# Patient Record
Sex: Female | Born: 1985
Health system: Southern US, Community
[De-identification: ages and names within clinical notes are randomized; demographics above are authoritative.]

## PROBLEM LIST (undated history)

## (undated) ENCOUNTER — Inpatient Hospital Stay (HOSPITAL_COMMUNITY): Payer: 59

## (undated) DIAGNOSIS — J45909 Unspecified asthma, uncomplicated: Secondary | ICD-10-CM

## (undated) HISTORY — DX: Unspecified asthma, uncomplicated: J45.909

---

## 2014-12-16 ENCOUNTER — Ambulatory Visit: Payer: Medicaid Other | Attending: Internal Medicine | Admitting: Internal Medicine

## 2014-12-16 ENCOUNTER — Encounter: Payer: Self-pay | Admitting: Internal Medicine

## 2014-12-16 VITALS — BP 126/80 | HR 98 | Temp 98.8°F | Resp 16 | Ht 66.5 in | Wt 211.0 lb

## 2014-12-16 DIAGNOSIS — O99330 Smoking (tobacco) complicating pregnancy, unspecified trimester: Secondary | ICD-10-CM | POA: Diagnosis not present

## 2014-12-16 DIAGNOSIS — Z8249 Family history of ischemic heart disease and other diseases of the circulatory system: Secondary | ICD-10-CM | POA: Diagnosis not present

## 2014-12-16 DIAGNOSIS — Z331 Pregnant state, incidental: Secondary | ICD-10-CM | POA: Diagnosis not present

## 2014-12-16 DIAGNOSIS — Z3201 Encounter for pregnancy test, result positive: Secondary | ICD-10-CM | POA: Diagnosis present

## 2014-12-16 DIAGNOSIS — Z3A00 Weeks of gestation of pregnancy not specified: Secondary | ICD-10-CM | POA: Insufficient documentation

## 2014-12-16 DIAGNOSIS — Z3490 Encounter for supervision of normal pregnancy, unspecified, unspecified trimester: Secondary | ICD-10-CM

## 2014-12-16 DIAGNOSIS — F17210 Nicotine dependence, cigarettes, uncomplicated: Secondary | ICD-10-CM | POA: Insufficient documentation

## 2014-12-16 DIAGNOSIS — N926 Irregular menstruation, unspecified: Secondary | ICD-10-CM | POA: Diagnosis not present

## 2014-12-16 LAB — POCT URINE PREGNANCY: PREG TEST UR: POSITIVE — AB

## 2014-12-16 NOTE — Progress Notes (Signed)
Positive pregnancy test at home  LMP 11/10/2014

## 2014-12-16 NOTE — Progress Notes (Signed)
Patient ID: Rhonda Mullins Aschenbrenner, female   DOB: 02/25/1985, 29 y.o.   MRN: [id number]  CC: positive home pregnancy test  HPI: Rhonda Mullins Liddy is a 29 y.o. female here to establish care.  Patient has no past medical history. Patient reports that she took a home pregnancy test two weeks ago and had a positive result. She states that in March of this year she had a ectopic pregnancy and is afraid the same thing may happen again. She has begun taking a daily prenatal vitamin. She needs a letter confirming pregnancy and a referral to a OB/GYN. Her LMP was 11/10/14. She has no complaints today.   No Known Allergies History reviewed. No pertinent past medical history. No current outpatient prescriptions on file prior to visit.   No current facility-administered medications on file prior to visit.   Family History  Problem Relation Age of Onset  . Hypertension Mother    Social History   Social History  . Marital Status: Married    Spouse Name: N/A  . Number of Children: N/A  . Years of Education: N/A   Occupational History  . Not on file.   Social History Main Topics  . Smoking status: Current Every Day Smoker -- 0.50 packs/day for 2 years    Types: Cigarettes  . Smokeless tobacco: Not on file  . Alcohol Use: No  . Drug Use: No  . Sexual Activity: Not on file   Other Topics Concern  . Not on file   Social History Narrative  . No narrative on file    Review of Systems: Constitutional: Negative for fever, chills, diaphoresis, activity change, appetite change and fatigue. HENT: Negative for ear pain, nosebleeds, congestion, facial swelling, rhinorrhea, neck pain, neck stiffness and ear discharge.  Eyes: Negative for pain, discharge, redness, itching and visual disturbance. Respiratory: Negative for cough, choking, chest tightness, shortness of breath, wheezing and stridor.  Cardiovascular: Negative for chest pain, palpitations and leg swelling. Gastrointestinal: Negative for abdominal  distention. Genitourinary: Negative for dysuria, urgency, frequency, hematuria, flank pain, decreased urine volume, difficulty urinating and dyspareunia.  Musculoskeletal: Negative for back pain, joint swelling, arthralgias and gait problem. Neurological: Negative for dizziness, tremors, seizures, syncope, facial asymmetry, speech difficulty, weakness, light-headedness, numbness and headaches.  Hematological: Negative for adenopathy. Does not bruise/bleed easily. Psychiatric/Behavioral: Negative for hallucinations, behavioral problems, confusion, dysphoric mood, decreased concentration and agitation.    Objective:   Filed Vitals:   12/16/14 1632  BP: 126/80  Pulse: 98  Temp: 98.8 F (37.1 C)  Resp: 16    Physical Exam  Constitutional: She is oriented to person, place, and time.  Cardiovascular: Normal rate, regular rhythm and normal heart sounds.   Pulmonary/Chest: Effort normal and breath sounds normal.  Neurological: She is alert and oriented to person, place, and time.  Skin: Skin is warm and dry.  Psychiatric: She has a normal mood and affect.    No results found for: WBC, HGB, HCT, MCV, PLT No results found for: CREATININE, BUN, NA, K, CL, CO2  No results found for: HGBA1C Lipid Panel  No results found for: CHOL, TRIG, HDL, CHOLHDL, VLDL, LDLCALC     Assessment and plan:   Athena Masseenarkai was seen today for menstrual problem.  Diagnoses and all orders for this visit:  Missed period -     POCT urine pregnancy  Pregnancy -     Ambulatory referral to Obstetrics / Gynecology Letter given confirming pregnancy. Referral sent  Follow up as needed.  Ambrose Finland, NP-C [facility] [phone] 12/16/2014, 5:17 PM

## 2015-01-12 ENCOUNTER — Encounter: Payer: Medicaid Other | Admitting: Family Medicine

## 2015-02-21 NOTE — L&D Delivery Note (Signed)
Pt complete and at +2 station with uncontrollable urge to push - no pain meds.  Pt pushed 2-3times  to deliver a viable female infant in OP position over intact perineum. Anterior shoulder delivered immediately  and posterior shoulder delivered with next push Body easily followed next. Infant placed on mothers abdomen with luscious spontaneous cry noted. . Cord was then clamped and cut by FOB after . Cord blood obtained. Placenta then delivered about 1-2 mins later intact, 3VC shultz. Fundal massage performed and pitocin per protocol. Fundus firm. Inspection confirmed no lacerations.  Mother and baby stable. Counts correct

## 2015-03-03 DIAGNOSIS — Z36 Encounter for antenatal screening of mother: Secondary | ICD-10-CM | POA: Diagnosis not present

## 2015-03-23 DIAGNOSIS — Z3A19 19 weeks gestation of pregnancy: Secondary | ICD-10-CM | POA: Diagnosis not present

## 2015-03-23 DIAGNOSIS — O0932 Supervision of pregnancy with insufficient antenatal care, second trimester: Secondary | ICD-10-CM | POA: Diagnosis not present

## 2015-03-23 DIAGNOSIS — O99332 Smoking (tobacco) complicating pregnancy, second trimester: Secondary | ICD-10-CM | POA: Diagnosis not present

## 2015-03-23 DIAGNOSIS — Z36 Encounter for antenatal screening of mother: Secondary | ICD-10-CM | POA: Diagnosis not present

## 2015-04-01 DIAGNOSIS — N390 Urinary tract infection, site not specified: Secondary | ICD-10-CM | POA: Diagnosis not present

## 2015-05-03 DIAGNOSIS — O99332 Smoking (tobacco) complicating pregnancy, second trimester: Secondary | ICD-10-CM | POA: Diagnosis not present

## 2015-05-03 DIAGNOSIS — Z3A24 24 weeks gestation of pregnancy: Secondary | ICD-10-CM | POA: Diagnosis not present

## 2015-06-03 DIAGNOSIS — Z3A29 29 weeks gestation of pregnancy: Secondary | ICD-10-CM | POA: Diagnosis not present

## 2015-06-03 DIAGNOSIS — O99333 Smoking (tobacco) complicating pregnancy, third trimester: Secondary | ICD-10-CM | POA: Diagnosis not present

## 2015-06-03 DIAGNOSIS — Z36 Encounter for antenatal screening of mother: Secondary | ICD-10-CM | POA: Diagnosis not present

## 2015-06-03 DIAGNOSIS — Z23 Encounter for immunization: Secondary | ICD-10-CM | POA: Diagnosis not present

## 2015-06-17 DIAGNOSIS — Z3483 Encounter for supervision of other normal pregnancy, third trimester: Secondary | ICD-10-CM | POA: Diagnosis not present

## 2015-06-17 DIAGNOSIS — Z3A31 31 weeks gestation of pregnancy: Secondary | ICD-10-CM | POA: Diagnosis not present

## 2015-06-18 DIAGNOSIS — R7309 Other abnormal glucose: Secondary | ICD-10-CM | POA: Diagnosis not present

## 2015-06-23 ENCOUNTER — Encounter: Payer: Medicaid Other | Attending: Obstetrics and Gynecology

## 2015-06-23 VITALS — Ht 66.0 in | Wt 202.0 lb

## 2015-06-23 DIAGNOSIS — O24419 Gestational diabetes mellitus in pregnancy, unspecified control: Secondary | ICD-10-CM | POA: Diagnosis not present

## 2015-06-23 DIAGNOSIS — O24410 Gestational diabetes mellitus in pregnancy, diet controlled: Secondary | ICD-10-CM

## 2015-07-01 DIAGNOSIS — Z3A33 33 weeks gestation of pregnancy: Secondary | ICD-10-CM | POA: Diagnosis not present

## 2015-07-01 DIAGNOSIS — O24410 Gestational diabetes mellitus in pregnancy, diet controlled: Secondary | ICD-10-CM | POA: Diagnosis not present

## 2015-07-01 DIAGNOSIS — O99333 Smoking (tobacco) complicating pregnancy, third trimester: Secondary | ICD-10-CM | POA: Diagnosis not present

## 2015-07-16 DIAGNOSIS — O24410 Gestational diabetes mellitus in pregnancy, diet controlled: Secondary | ICD-10-CM | POA: Diagnosis not present

## 2015-07-16 DIAGNOSIS — O99333 Smoking (tobacco) complicating pregnancy, third trimester: Secondary | ICD-10-CM | POA: Diagnosis not present

## 2015-07-16 DIAGNOSIS — Z3A35 35 weeks gestation of pregnancy: Secondary | ICD-10-CM | POA: Diagnosis not present

## 2015-07-16 DIAGNOSIS — Z36 Encounter for antenatal screening of mother: Secondary | ICD-10-CM | POA: Diagnosis not present

## 2015-07-16 DIAGNOSIS — N760 Acute vaginitis: Secondary | ICD-10-CM | POA: Diagnosis not present

## 2015-07-20 NOTE — Progress Notes (Signed)
  Patient was seen on 5/30/17for Gestational Diabetes self-management class at the [facility]. The following learning objectives were met by the patient during this course:   States the definition of Gestational Diabetes  States why dietary management is important in controlling blood glucose  Describes the effects each nutrient has on blood glucose levels  Demonstrates ability to create a balanced meal plan  Demonstrates carbohydrate counting   States when to check blood glucose levels  Demonstrates proper blood glucose monitoring techniques  States the effect of stress and exercise on blood glucose levels  States the importance of limiting caffeine and abstaining from alcohol and smoking  Blood glucose monitor given: Verio Lot # [id number] Exp: 09/19/16 Blood glucose reading: 88  Patient instructed to monitor glucose levels: FBS: 60 - <90 1 hour: <140  *Patient received handouts:  Nutrition Diabetes and Pregnancy  Carbohydrate Counting List  Patient will be seen for follow-up as needed.

## 2015-08-12 ENCOUNTER — Encounter: Payer: Self-pay | Admitting: Obstetrics and Gynecology

## 2015-08-13 ENCOUNTER — Encounter: Payer: Self-pay | Admitting: Obstetrics and Gynecology

## 2015-08-13 ENCOUNTER — Telehealth (HOSPITAL_COMMUNITY): Payer: Self-pay | Admitting: General Practice

## 2015-08-13 ENCOUNTER — Encounter (HOSPITAL_COMMUNITY): Payer: Self-pay | Admitting: General Practice

## 2015-08-14 ENCOUNTER — Inpatient Hospital Stay (HOSPITAL_COMMUNITY)
Admission: RE | Admit: 2015-08-14 | Discharge: 2015-08-16 | DRG: 775 | Disposition: A | Discharge status: Home / Self Care | Payer: 59 | Source: Ambulatory Visit | Attending: Obstetrics and Gynecology | Admitting: Obstetrics and Gynecology

## 2015-08-14 ENCOUNTER — Inpatient Hospital Stay (HOSPITAL_COMMUNITY): Payer: 59 | Admitting: Anesthesiology

## 2015-08-14 ENCOUNTER — Encounter (HOSPITAL_COMMUNITY): Payer: Self-pay

## 2015-08-14 DIAGNOSIS — Z3490 Encounter for supervision of normal pregnancy, unspecified, unspecified trimester: Secondary | ICD-10-CM

## 2015-08-14 DIAGNOSIS — O9952 Diseases of the respiratory system complicating childbirth: Secondary | ICD-10-CM | POA: Diagnosis present

## 2015-08-14 DIAGNOSIS — Z8249 Family history of ischemic heart disease and other diseases of the circulatory system: Secondary | ICD-10-CM | POA: Diagnosis not present

## 2015-08-14 DIAGNOSIS — J45909 Unspecified asthma, uncomplicated: Secondary | ICD-10-CM | POA: Diagnosis present

## 2015-08-14 DIAGNOSIS — O99334 Smoking (tobacco) complicating childbirth: Secondary | ICD-10-CM | POA: Diagnosis present

## 2015-08-14 DIAGNOSIS — Z3A39 39 weeks gestation of pregnancy: Secondary | ICD-10-CM | POA: Diagnosis not present

## 2015-08-14 DIAGNOSIS — F17210 Nicotine dependence, cigarettes, uncomplicated: Secondary | ICD-10-CM | POA: Diagnosis present

## 2015-08-14 DIAGNOSIS — O24420 Gestational diabetes mellitus in childbirth, diet controlled: Principal | ICD-10-CM | POA: Diagnosis present

## 2015-08-14 DIAGNOSIS — O99824 Streptococcus B carrier state complicating childbirth: Secondary | ICD-10-CM | POA: Diagnosis present

## 2015-08-14 LAB — CBC
HCT: 34.5 % — ABNORMAL LOW (ref 36.0–46.0)
HEMOGLOBIN: 11.9 g/dL — AB (ref 12.0–15.0)
MCH: 29.1 pg (ref 26.0–34.0)
MCHC: 34.5 g/dL (ref 30.0–36.0)
MCV: 84.4 fL (ref 78.0–100.0)
Platelets: 226 10*3/uL (ref 150–400)
RBC: 4.09 MIL/uL (ref 3.87–5.11)
RDW: 14 % (ref 11.5–15.5)
WBC: 8.1 10*3/uL (ref 4.0–10.5)

## 2015-08-14 LAB — TYPE AND SCREEN
ABO/RH(D): B POS
Antibody Screen: NEGATIVE

## 2015-08-14 LAB — ABO/RH: ABO/RH(D): B POS

## 2015-08-14 MED ORDER — OXYTOCIN 40 UNITS IN LACTATED RINGERS INFUSION - SIMPLE MED: 2.5000 [IU]/h | INTRAVENOUS | Status: DC

## 2015-08-14 MED ORDER — PHENYLEPHRINE 40 MCG/ML (10ML) SYRINGE FOR IV PUSH (FOR BLOOD PRESSURE SUPPORT)
80.0000 ug | PREFILLED_SYRINGE | INTRAVENOUS | Status: DC | PRN
  Filled 2015-08-14: qty 10
  Filled 2015-08-14: qty 5

## 2015-08-14 MED ORDER — DIBUCAINE 1 % RE OINT: 1.0000 "application " | TOPICAL_OINTMENT | RECTAL | Status: DC | PRN

## 2015-08-14 MED ORDER — ACETAMINOPHEN 325 MG PO TABS: 650.0000 mg | ORAL_TABLET | ORAL | Status: DC | PRN

## 2015-08-14 MED ORDER — SENNOSIDES-DOCUSATE SODIUM 8.6-50 MG PO TABS
2.0000 | ORAL_TABLET | ORAL | Status: DC
  Administered 2015-08-14: 2 via ORAL
  Filled 2015-08-14 (×2): qty 2

## 2015-08-14 MED ORDER — EPHEDRINE 5 MG/ML INJ
10.0000 mg | INTRAVENOUS | Status: DC | PRN
  Filled 2015-08-14: qty 2

## 2015-08-14 MED ORDER — SOD CITRATE-CITRIC ACID 500-334 MG/5ML PO SOLN: 30.0000 mL | ORAL | Status: DC | PRN

## 2015-08-14 MED ORDER — ONDANSETRON HCL 4 MG PO TABS: 4.0000 mg | ORAL_TABLET | ORAL | Status: DC | PRN

## 2015-08-14 MED ORDER — LACTATED RINGERS IV SOLN: 500.0000 mL | INTRAVENOUS | Status: DC | PRN

## 2015-08-14 MED ORDER — SIMETHICONE 80 MG PO CHEW: 80.0000 mg | CHEWABLE_TABLET | ORAL | Status: DC | PRN

## 2015-08-14 MED ORDER — COCONUT OIL OIL: 1.0000 "application " | TOPICAL_OIL | Status: DC | PRN

## 2015-08-14 MED ORDER — LIDOCAINE HCL (PF) 1 % IJ SOLN
30.0000 mL | INTRAMUSCULAR | Status: DC | PRN
  Filled 2015-08-14: qty 30

## 2015-08-14 MED ORDER — PENICILLIN G POTASSIUM 5000000 UNITS IJ SOLR
5.0000 10*6.[IU] | Freq: Once | INTRAMUSCULAR | Status: AC
  Administered 2015-08-14: 5 10*6.[IU] via INTRAVENOUS
  Filled 2015-08-14: qty 5

## 2015-08-14 MED ORDER — DEXTROSE 5 % IV SOLN
2.5000 10*6.[IU] | INTRAVENOUS | Status: DC
  Administered 2015-08-14: 2.5 10*6.[IU] via INTRAVENOUS
  Filled 2015-08-14 (×5): qty 2.5

## 2015-08-14 MED ORDER — OXYTOCIN 40 UNITS IN LACTATED RINGERS INFUSION - SIMPLE MED
1.0000 m[IU]/min | INTRAVENOUS | Status: DC
  Administered 2015-08-14: 2 m[IU]/min via INTRAVENOUS
  Filled 2015-08-14: qty 1000

## 2015-08-14 MED ORDER — OXYCODONE-ACETAMINOPHEN 5-325 MG PO TABS: 1.0000 | ORAL_TABLET | ORAL | Status: DC | PRN

## 2015-08-14 MED ORDER — DIPHENHYDRAMINE HCL 50 MG/ML IJ SOLN: 12.5000 mg | INTRAMUSCULAR | Status: DC | PRN

## 2015-08-14 MED ORDER — ONDANSETRON HCL 4 MG/2ML IJ SOLN: 4.0000 mg | Freq: Four times a day (QID) | INTRAMUSCULAR | Status: DC | PRN

## 2015-08-14 MED ORDER — PHENYLEPHRINE 40 MCG/ML (10ML) SYRINGE FOR IV PUSH (FOR BLOOD PRESSURE SUPPORT)
80.0000 ug | PREFILLED_SYRINGE | INTRAVENOUS | Status: DC | PRN
  Filled 2015-08-14: qty 5

## 2015-08-14 MED ORDER — TERBUTALINE SULFATE 1 MG/ML IJ SOLN
0.2500 mg | Freq: Once | INTRAMUSCULAR | Status: DC | PRN
  Filled 2015-08-14: qty 1

## 2015-08-14 MED ORDER — PRENATAL MULTIVITAMIN CH
1.0000 | ORAL_TABLET | Freq: Every day | ORAL | Status: DC
  Administered 2015-08-15 – 2015-08-16 (×2): 1 via ORAL
  Filled 2015-08-14 (×2): qty 1

## 2015-08-14 MED ORDER — OXYTOCIN BOLUS FROM INFUSION: 500.0000 mL | INTRAVENOUS | Status: DC

## 2015-08-14 MED ORDER — ZOLPIDEM TARTRATE 5 MG PO TABS: 5.0000 mg | ORAL_TABLET | Freq: Every evening | ORAL | Status: DC | PRN

## 2015-08-14 MED ORDER — OXYCODONE-ACETAMINOPHEN 5-325 MG PO TABS: 2.0000 | ORAL_TABLET | ORAL | Status: DC | PRN

## 2015-08-14 MED ORDER — LACTATED RINGERS IV SOLN
INTRAVENOUS | Status: DC
  Administered 2015-08-14 (×2): via INTRAVENOUS

## 2015-08-14 MED ORDER — BUTORPHANOL TARTRATE 1 MG/ML IJ SOLN: 1.0000 mg | INTRAMUSCULAR | Status: DC | PRN

## 2015-08-14 MED ORDER — WITCH HAZEL-GLYCERIN EX PADS: 1.0000 "application " | MEDICATED_PAD | CUTANEOUS | Status: DC | PRN

## 2015-08-14 MED ORDER — ONDANSETRON HCL 4 MG/2ML IJ SOLN: 4.0000 mg | INTRAMUSCULAR | Status: DC | PRN

## 2015-08-14 MED ORDER — FENTANYL 2.5 MCG/ML BUPIVACAINE 1/10 % EPIDURAL INFUSION (WH - ANES)
14.0000 mL/h | INTRAMUSCULAR | Status: DC | PRN
Start: 2015-08-14 — End: 2015-08-14
  Administered 2015-08-14: 14 mL/h via EPIDURAL
  Filled 2015-08-14: qty 125

## 2015-08-14 MED ORDER — LACTATED RINGERS IV SOLN
500.0000 mL | Freq: Once | INTRAVENOUS | Status: AC
Start: 2015-08-14 — End: 2015-08-14
  Administered 2015-08-14: 500 mL via INTRAVENOUS

## 2015-08-14 MED ORDER — LIDOCAINE HCL (PF) 1 % IJ SOLN
INTRAMUSCULAR | Status: DC | PRN
  Administered 2015-08-14: 6 mL via EPIDURAL
  Administered 2015-08-14: 4 mL

## 2015-08-14 MED ORDER — TETANUS-DIPHTH-ACELL PERTUSSIS 5-2.5-18.5 LF-MCG/0.5 IM SUSP: 0.5000 mL | Freq: Once | INTRAMUSCULAR | Status: DC

## 2015-08-14 MED ORDER — DIPHENHYDRAMINE HCL 25 MG PO CAPS: 25.0000 mg | ORAL_CAPSULE | Freq: Four times a day (QID) | ORAL | Status: DC | PRN

## 2015-08-14 MED ORDER — BENZOCAINE-MENTHOL 20-0.5 % EX AERO
1.0000 "application " | INHALATION_SPRAY | CUTANEOUS | Status: DC | PRN
  Administered 2015-08-14: 1 via TOPICAL
  Filled 2015-08-14: qty 56

## 2015-08-14 MED ORDER — IBUPROFEN 600 MG PO TABS
600.0000 mg | ORAL_TABLET | Freq: Four times a day (QID) | ORAL | Status: DC
  Administered 2015-08-14 – 2015-08-16 (×8): 600 mg via ORAL
  Filled 2015-08-14 (×8): qty 1

## 2015-08-14 NOTE — Lactation Note (Signed)
This note was copied from a baby's chart. Lactation Consultation Note  Patient Name: Rhonda Mullins ZOXWR'UToday's Date: 08/14/2015 Reason for consult: Initial assessment  Baby 4 hours old. Mom reports that baby latching well. Mom states that she nursed her oldest daughter for 2 years, and her second daughter for just over a year. Mom states that she had a good breast milk supply and no breastfeeding issues. Mom holding the baby STS, so enc mom to continue to offer lots of STS and nurse with cues. Enc mom to ask for assistance with latching as needed. [facility], aware of OP/BFSG and LC phone line assistance after d/c.  Maternal Data Does the patient have breastfeeding experience prior to this delivery?: Yes  Feeding Feeding Type: Breast Fed Length of feed: 10 min  LATCH Score/Interventions Latch: Repeated attempts needed to sustain latch, nipple held in mouth throughout feeding, stimulation needed to elicit sucking reflex. Intervention(s): Adjust position;Breast massage;Breast compression;Assist with latch  Audible Swallowing: A few with stimulation Intervention(s): Skin to skin;Hand expression  Type of Nipple: Everted at rest and after stimulation  Comfort (Breast/Nipple): Soft / non-tender     Hold (Positioning): Assistance needed to correctly position infant at breast and maintain latch. Intervention(s): Breastfeeding basics reviewed;Support Pillows;Position options;Skin to skin  LATCH Score: 7  Lactation Tools Discussed/Used     Consult Status Consult Status: Follow-up Date: 08/15/15 Follow-up type: In-patient    Rhonda Mullins, Jalesia Loudenslager 08/14/2015, 7:47 PM

## 2015-08-14 NOTE — Anesthesia Preprocedure Evaluation (Signed)

## 2015-08-14 NOTE — H&P (Signed)
Rhonda Mullins is a 30 y.o. female presenting for scheduled iol for favorable cervix at term. Maternal Medical History:  Reason for admission: Nausea. Favorable cervix at term  Contractions: Onset was 2 days ago.   Frequency: irregular.   Perceived severity is mild.    Fetal activity: Perceived fetal activity is normal.   Last perceived fetal movement was within the past hour.    Prenatal complications: no prenatal complications Prenatal Complications - Diabetes: gestational. Diabetes is managed by diet.      OB History    Gravida Para Term Preterm AB TAB SAB Ectopic Multiple Living   5 2 2  0 2 1 0 1 0 2     Past Medical History  Diagnosis Date  . Asthma     allergy related   History reviewed. No pertinent past surgical history. Family History: family history includes Hypertension in her mother. Social History:  reports that she has been smoking Cigarettes.  She has a 1 pack-year smoking history. She does not have any smokeless tobacco history on file. She reports that she does not drink alcohol or use illicit drugs.   Prenatal Transfer Tool  Maternal Diabetes: Yes:  Diabetes Type:  Diet controlled Genetic Screening: Normal Maternal Ultrasounds/Referrals: Normal Fetal Ultrasounds or other Referrals:  None Maternal Substance Abuse:  No Significant Maternal Medications:  None Significant Maternal Lab Results:  Lab values include: Group B Strep positive Other Comments:  None  Review of Systems  Constitutional: Positive for malaise/fatigue. Negative for fever, chills and weight loss.  Eyes: Negative for blurred vision.  Respiratory: Negative for shortness of breath.   Cardiovascular: Negative for chest pain.  Gastrointestinal: Negative for heartburn, nausea, vomiting and abdominal pain.  Genitourinary: Negative for dysuria.  Musculoskeletal: Negative for back pain.  Skin: Negative for itching and rash.  Neurological: Negative for dizziness and headaches.   Psychiatric/Behavioral: Negative for depression, suicidal ideas, hallucinations and substance abuse. The patient is not nervous/anxious.     Dilation: 4 Effacement (%): 70, 80 Station: -2 Exam by:: Demetrice Amstutz Blood pressure 130/70, pulse 88, temperature 98.1 F (36.7 C), temperature source Oral, resp. rate 18, height 5\' 6"  (1.676 m), weight 200 lb (90.719 kg), last menstrual period 11/10/2014. Maternal Exam:  Uterine Assessment: Contraction strength is moderate.  Contraction frequency is regular.   Abdomen: Patient reports generalized tenderness.  Estimated fetal weight is AGA.   Fetal presentation: vertex  Introitus: Normal vulva. Normal vagina.  Pelvis: adequate for delivery.   Cervix: Cervix evaluated by digital exam.     Physical Exam  Constitutional: She is oriented to person, place, and time. She appears well-developed.  Neck: Normal range of motion.  Cardiovascular: Normal rate.   Respiratory: Effort normal.  GI: Soft. There is generalized tenderness.  Genitourinary: Vagina normal and uterus normal.  Musculoskeletal: Normal range of motion.  Neurological: She is alert and oriented to person, place, and time.  Skin: Skin is warm.  Psychiatric: She has a normal mood and affect. Her behavior is normal. Judgment and thought content normal.    Prenatal labs: ABO, Rh: --/--/B POS (06/24 [id number]) Antibody: NEG (06/24 0828) Rubella:   RPR:    HBsAg:    HIV:    GBS:     Assessment/Plan: [id number]: G5P2022 at 39 4/[redacted]wks gestation with GDMA1 for iol at term with favorable cervix Pit at 4mus AROM performed with scant return of fluid Pain control prn PCN for GBs tx Anticipate svd    [facility] Polycl[facility] LtdCecilia Worema Ayanna Gheen 08/14/2015, 11:51 AM

## 2015-08-14 NOTE — Progress Notes (Signed)
Patient ID: Rhonda Mullins, female   DOB: 05/28/1985, 30 y.o.   MRN: [id number] Pt c/o painful contractions.  SVE 5/100/-1  Pt opts for nitrous gas to help relieve pain - order placed Continue  Pitocin per protocol - now at 10mus

## 2015-08-14 NOTE — Anesthesia Pain Management Evaluation Note (Signed)
  CRNA Pain Management Visit Note  Patient: Rhonda Mullins, 30 y.o., female  "Hello I am a member of the anesthesia team at [facility]. We have an anesthesia team available at all times to provide care throughout the hospital, including epidural management and anesthesia for C-section. I don't know your plan for the delivery whether it a natural birth, water birth, IV sedation, nitrous supplementation, doula or epidural, but we want to meet your pain goals."   1.Was your pain managed to your expectations on prior hospitalizations?     2.What is your expectation for pain management during this hospitalization?       3.How can we help you reach that goal?   Record the patient's initial score and the patient's pain goal.   Pain: 3  Pain Goal: 10; pt wishes to deliver naturally as previous two pregnancies.  [facility] wants you to be able to say your pain was always managed very well.  Rhonda Mullins,Rhonda Mullins 08/14/2015

## 2015-08-14 NOTE — Anesthesia Procedure Notes (Signed)
Epidural Patient location during procedure: OB  Preanesthetic Checklist Completed: patient identified, site marked, surgical consent, pre-op evaluation, timeout performed, IV checked, risks and benefits discussed and monitors and equipment checked  Epidural Patient position: sitting Prep: site prepped and draped and DuraPrep Patient monitoring: continuous pulse ox and blood pressure Approach: midline Injection technique: LOR air  Needle:  Needle type: Tuohy  Needle gauge: 17 G Needle length: 9 cm and 9 Needle insertion depth: 6 cm Catheter type: closed end flexible Catheter size: 19 Gauge Catheter at skin depth: 12 cm Test dose: negative  Assessment Events: blood not aspirated, injection not painful, no injection resistance, negative IV test and no paresthesia  Additional Notes Dosing of Epidural:  1st dose, through catheter .............................................  Xylocaine 40 mg  2nd dose, through catheter, after waiting 3 minutes.........Xylocaine 60 mg    As each dose occurred, patient was free of IV sx; and patient exhibited no evidence of SA injection.  Patient is more comfortable after epidural dosed. Please see RN's note for documentation of vital signs,and FHR which are stable.  Patient reminded not to try to ambulate with numb legs, and that an RN must be present when she attempts to get up.        

## 2015-08-15 LAB — CBC
HCT: 29.9 % — ABNORMAL LOW (ref 36.0–46.0)
HEMOGLOBIN: 10.3 g/dL — AB (ref 12.0–15.0)
MCH: 28.6 pg (ref 26.0–34.0)
MCHC: 34.4 g/dL (ref 30.0–36.0)
MCV: 83.1 fL (ref 78.0–100.0)
Platelets: 224 10*3/uL (ref 150–400)
RBC: 3.6 MIL/uL — AB (ref 3.87–5.11)
RDW: 13.9 % (ref 11.5–15.5)
WBC: 11.8 10*3/uL — AB (ref 4.0–10.5)

## 2015-08-15 LAB — RPR: RPR: NONREACTIVE

## 2015-08-15 MED ORDER — IBUPROFEN 600 MG PO TABS: 600.0000 mg | ORAL_TABLET | Freq: Four times a day (QID) | ORAL | Status: DC

## 2015-08-15 NOTE — Lactation Note (Signed)
This note was copied from a baby's chart. Lactation Consultation Note; Mom called for assist with latch- wants to make sure she is doing it right. Experienced BF mom but youngest is 29 years old. Baby sleepy. Mom reports baby fed a lot through the night and has been sleepy today. Assisted mom with latch mom reports some pain with latch- untucked bottom lip and mom reports that feels better. Baby off to sleep. Encouragement given. No further questions at present. Reviewed our phone number to call with questions, OP appointments and BFSG. To call prn  Patient Name: Girl Dessa Phienarka Mckoy ZOXWR'UToday's Date: 08/15/2015 Reason for consult: Follow-up assessment   Maternal Data Formula Feeding for Exclusion: No Has patient been taught Hand Expression?: Yes Does the patient have breastfeeding experience prior to this delivery?: Yes  Feeding Feeding Type: Breast Fed Length of feed: 5 min  LATCH Score/Interventions Latch: Too sleepy or reluctant, no latch achieved, no sucking elicited. Intervention(s):  (few sucks) Intervention(s): Adjust position;Assist with latch  Audible Swallowing: None Intervention(s): Skin to skin Intervention(s): Skin to skin  Type of Nipple: Everted at rest and after stimulation  Comfort (Breast/Nipple): Filling, red/small blisters or bruises, mild/mod discomfort  Problem noted: Mild/Moderate discomfort Interventions (Mild/moderate discomfort): Hand expression  Hold (Positioning): Assistance needed to correctly position infant at breast and maintain latch. Intervention(s): Breastfeeding basics reviewed  LATCH Score: 4  Lactation Tools Discussed/Used     Consult Status Consult Status: Follow-up Date: 08/16/15 Follow-up type: In-patient    Pamelia HoitWeeks, Waris Rodger D 08/15/2015, 1:18 PM

## 2015-08-15 NOTE — Discharge Summary (Signed)
OB Discharge Summary     Patient Name: Rhonda Mullins DOB: 08/09/1985 MRN: [id number]  Date of admission: 08/14/2015 Delivering MD: Pryor OchoaBANGA, Emannuel Vise [facility]   Date of discharge: 08/15/2015  Admitting diagnosis: INDUCTION Intrauterine pregnancy: 5165w4d     Secondary diagnosis:  Active Problems:   Pregnancy   SVD (spontaneous vaginal delivery)   Postpartum care following vaginal delivery  Additional problems: none     Discharge diagnosis: Term Pregnancy Delivered and GDM A1                                                                                                Post partum procedures:none  Augmentation: AROM and Pitocin  Complications: None  Hospital course:  Induction of Labor With Vaginal Delivery   30 y.o. yo [id number] at 2265w4d was admitted to the hospital 08/14/2015 for induction of labor.  Indication for induction: A1 DM.  Patient had an uncomplicated labor course as follows: Membrane Rupture Time/Date: 10:20 AM ,08/14/2015   Intrapartum Procedures: Episiotomy: None [1]                                         Lacerations:  None [1]  Patient had delivery of a Viable infant.  Information for the patient's newborn:  Omer JackCooper, Girl Raeshawn [[id number]][[id number]]  Delivery Method: Vaginal, Spontaneous Delivery (Filed from Delivery Summary)   08/14/2015  Details of delivery can be found in separate delivery note.  Patient had a routine postpartum course. Patient is discharged home 08/15/2015.   Physical exam  Filed Vitals:   08/14/15 1831 08/14/15 1930 08/15/15 0000 08/15/15 0500  BP: 133/69 120/63 126/60 125/71  Pulse: 76 78 75 71  Temp: 98.5 F (36.9 C) 98.3 F (36.8 C) 98.4 F (36.9 C) 98.1 F (36.7 C)  TempSrc: Oral Oral Oral Oral  Resp: 20 18 18 18   Height:      Weight:      SpO2: 100%      General: alert, cooperative and no distress Lochia: appropriate Uterine Fundus: firm Incision: N/A DVT Evaluation: No evidence of DVT seen on physical exam. No significant  calf/ankle edema. Labs: Lab Results  Component Value Date   WBC 11.8* 08/15/2015   HGB 10.3* 08/15/2015   HCT 29.9* 08/15/2015   MCV 83.1 08/15/2015   PLT 224 08/15/2015   No flowsheet data found.  Discharge instruction: per After Visit Summary and "Baby and Me Booklet".  After visit meds:    Medication List    STOP taking these medications        diphenhydrAMINE 25 MG tablet  Commonly known as:  BENADRYL      TAKE these medications        ibuprofen 600 MG tablet  Commonly known as:  ADVIL,MOTRIN  Take 1 tablet (600 mg total) by mouth every 6 (six) hours.     prenatal multivitamin Tabs tablet  Take 1 tablet by mouth daily at 12 noon.        Diet: routine diet  Activity:  Advance as tolerated. Pelvic rest for 6 weeks.   Outpatient follow up:6 weeks Follow up Appt:No future appointments. Follow up Visit:No Follow-up on file.  Postpartum contraception: Natural Family Planning  Newborn Data: Live born female  Birth Weight: 6 lb 2.8 oz (2801 g) APGAR: 8, 9  Baby Feeding: Breast Disposition:home with mother   08/15/2015 Sharol Givenecilia Worema [place] ColonyBanga, DO

## 2015-08-15 NOTE — Lactation Note (Deleted)
This note was copied from a baby's chart. Lactation Consultation Note; Mom attempting to latch baby as I went into room. Using #20 NS. Baby on and off the breast then going to sleep. Mom pumped for 15 min, obtained 12 ml transitional milk. Baby waking, bottle fed EBM to baby, still showing feeding cues bottle fed 10 ml of Alimentum and baby off to sleep. Parents needed assist with diaper change and burping baby. Parents pleased mom is making more milk. Encouraged to always breast feed first then give bottle of EBM or formula. Encouraged to pump after every feeding to promote milk supply.No questions at present. To call prn Patient Name: Rhonda Dessa Phienarka Thompson RUEAV'WToday's Date: 08/15/2015 Reason for consult: Follow-up assessment   Maternal Data Formula Feeding for Exclusion: No Has patient been taught Hand Expression?: Yes  Feeding Feeding Type: Bottle Fed - Breast Milk Length of feed: 5 min  LATCH Score/Interventions Latch: Repeated attempts needed to sustain latch, nipple held in mouth throughout feeding, stimulation needed to elicit sucking reflex.  Audible Swallowing: None  Type of Nipple: Everted at rest and after stimulation  Comfort (Breast/Nipple): Soft / non-tender     Hold (Positioning): Assistance needed to correctly position infant at breast and maintain latch. Intervention(s): Breastfeeding basics reviewed  LATCH Score: 6  Lactation Tools Discussed/Used Tools: Nipple Shields Nipple shield size: 20 WIC Program: No   Consult Status Consult Status: Follow-up Date: 08/16/15 Follow-up type: In-patient    Rhonda Mullins, Rhonda Mullins 08/15/2015, 11:58 AM

## 2015-08-15 NOTE — Anesthesia Postprocedure Evaluation (Signed)
Anesthesia Post Note  Patient: Rhonda Mullins  Procedure(s) Performed: * No procedures listed *  Patient location during evaluation: Mother Baby Anesthesia Type: Epidural Level of consciousness: oriented and awake and alert Pain management: pain level controlled Vital Signs Assessment: post-procedure vital signs reviewed and stable Respiratory status: spontaneous breathing and nonlabored ventilation Cardiovascular status: stable Postop Assessment: epidural receding, patient able to bend at knees, no signs of nausea or vomiting and adequate PO intake Anesthetic complications: no     Last Vitals:  Filed Vitals:   08/15/15 0000 08/15/15 0500  BP: 126/60 125/71  Pulse: 75 71  Temp: 36.9 C 36.7 C  Resp: 18 18    Last Pain:  Filed Vitals:   08/15/15 0616  PainSc: 4    Pain Goal: Patients Stated Pain Goal: 10 (08/14/15 0824)               Laban EmperorMalinova,Zaiyden Strozier Hristova

## 2015-08-15 NOTE — Discharge Instructions (Signed)
Nothing in vagina for 6 weeks.  No sex, tampons, and douching.  Other instructions as in [facility] Discharge Booklet. °

## 2015-08-15 NOTE — Progress Notes (Signed)
Post Partum Day 1 Subjective: no complaints, up ad lib, voiding, tolerating PO, + flatus and bonding well with baby. Breastfeeding.Requests dicharge to home today  Objective: Blood pressure 125/71, pulse 71, temperature 98.1 F (36.7 C), temperature source Oral, resp. rate 18, height 5\' 6"  (1.676 m), weight 200 lb (90.719 kg), last menstrual period 11/10/2014, SpO2 100 %, unknown if currently breastfeeding.  Physical Exam:  General: alert, cooperative and no distress Lochia: appropriate Uterine Fundus: firm Incision: n/a DVT Evaluation: No evidence of DVT seen on physical exam. No significant calf/ankle edema.   Recent Labs  08/14/15 0828 08/15/15 0525  HGB 11.9* 10.3*  HCT 34.5* 29.9*    Assessment/Plan: Discharge home, Breastfeeding and Contraception - prefers not to use any  Discharge instructions reviewed   LOS: 1 day   Edwinna AreolaCecilia Worema Banga 08/15/2015, 10:37 AM

## 2015-08-16 MED ORDER — PNEUMOCOCCAL VAC POLYVALENT 25 MCG/0.5ML IJ INJ: 0.5000 mL | INJECTION | INTRAMUSCULAR | Status: DC

## 2015-08-16 MED ORDER — PNEUMOCOCCAL VAC POLYVALENT 25 MCG/0.5ML IJ INJ
0.5000 mL | INJECTION | Freq: Once | INTRAMUSCULAR | Status: AC
  Administered 2015-08-16: 0.5 mL via INTRAMUSCULAR
  Filled 2015-08-16: qty 0.5

## 2015-08-16 MED FILL — IBUPROFEN 600 MG TABLET: 600 | 8 days supply | Qty: 30 | Fill #0

## 2015-08-16 NOTE — Lactation Note (Signed)
This note was copied from a baby's chart. Lactation Consultation Note  Patient Name: Girl Dessa Phienarka Vavra NWGNF'AToday's Date: 08/16/2015   Visited with Mom and FOB on day of discharge, baby 5542 hrs old.  Mom states baby is latching and feeding well.  Bilirubin level down this morning, output good.  Breasts are filling today.  Mom to pick up a DEBP in office as she has [employer] insurance.  Talked about engorgement prevention and treatment.  Encouraged skin to skin, and cue based feedings.  Encouraged to call us prn.  Reminded Mom of OP Lactation services available to her.     Judee ClaraSmith, Senan Urey E 08/16/2015, 10:17 AM

## 2015-08-16 NOTE — Progress Notes (Signed)
UR chart review completed.  

## 2015-08-16 NOTE — Progress Notes (Signed)
Patient ID: Rhonda Mullins, female   DOB: 09/23/1985, 30 y.o.   MRN: [id number]  Pt's d/c held 6/25 due to baby's bilirubin.  D/c 08/16/15.

## 2015-08-16 NOTE — Progress Notes (Signed)
Post Partum Day 2 Subjective: no complaints, up ad lib, voiding, tolerating PO and nl lochia, pain controlled  Objective: Blood pressure 120/63, pulse 65, temperature 97.6 F (36.4 C), temperature source Oral, resp. rate 16, height 5\' 6"  (1.676 m), weight 90.719 kg (200 lb), last menstrual period 11/10/2014, SpO2 100 %, unknown if currently breastfeeding.  Physical Exam:  General: alert and no distress Lochia: appropriate Uterine Fundus: firm    Recent Labs  08/14/15 0828 08/15/15 0525  HGB 11.9* 10.3*  HCT 34.5* 29.9*    Assessment/Plan: Discharge home.  D/C canceled yesterday due to baby's bilirubin.  D/C with routine d/c instructions, Motrin and PNV.  F/u 6 weeks   LOS: 2 days   Bovard-Stuckert, Codi Folkerts 08/16/2015, 7:10 AM

## 2015-08-21 DIAGNOSIS — R079 Chest pain, unspecified: Secondary | ICD-10-CM | POA: Diagnosis not present

## 2015-08-21 DIAGNOSIS — R52 Pain, unspecified: Secondary | ICD-10-CM | POA: Diagnosis not present

## 2015-08-21 DIAGNOSIS — F17210 Nicotine dependence, cigarettes, uncomplicated: Secondary | ICD-10-CM | POA: Diagnosis not present

## 2015-08-21 DIAGNOSIS — O909 Complication of the puerperium, unspecified: Secondary | ICD-10-CM | POA: Diagnosis not present

## 2015-08-21 DIAGNOSIS — R0602 Shortness of breath: Secondary | ICD-10-CM | POA: Diagnosis not present

## 2015-08-30 DIAGNOSIS — Z0120 Encounter for dental examination and cleaning without abnormal findings: Secondary | ICD-10-CM | POA: Diagnosis not present

## 2015-09-17 DIAGNOSIS — B351 Tinea unguium: Secondary | ICD-10-CM | POA: Diagnosis not present

## 2015-09-17 DIAGNOSIS — Z3009 Encounter for other general counseling and advice on contraception: Secondary | ICD-10-CM | POA: Diagnosis not present

## 2015-09-17 DIAGNOSIS — Z1389 Encounter for screening for other disorder: Secondary | ICD-10-CM | POA: Diagnosis not present

## 2015-11-19 DIAGNOSIS — N61 Mastitis without abscess: Secondary | ICD-10-CM | POA: Diagnosis not present

## 2016-02-21 NOTE — L&D Delivery Note (Signed)
Delivery Note Pt labored for about an hour and at 8:43 PM a viable female was delivered via Vaginal, Spontaneous Delivery (Presentation: ROA;  ).  APGAR: 9,9 , ; weight pending .   Placenta status: 3vc, shultz, delivered intact, .; no comps Anesthesia: nitrous gas Episiotomy: none  Lacerations: Periurethral Suture Repair: none Est. Blood Loss (mL): 250  Mom to postpartum.  Baby to Couplet care / Skin to Skin.  Cathrine Muster 11/23/2016, 8:55 PM

## 2016-03-07 DIAGNOSIS — N764 Abscess of vulva: Secondary | ICD-10-CM | POA: Diagnosis not present

## 2016-03-07 DIAGNOSIS — J45901 Unspecified asthma with (acute) exacerbation: Secondary | ICD-10-CM | POA: Diagnosis not present

## 2016-04-21 DIAGNOSIS — Z3201 Encounter for pregnancy test, result positive: Secondary | ICD-10-CM | POA: Diagnosis not present

## 2016-04-28 DIAGNOSIS — N912 Amenorrhea, unspecified: Secondary | ICD-10-CM | POA: Diagnosis not present

## 2016-04-28 DIAGNOSIS — O26849 Uterine size-date discrepancy, unspecified trimester: Secondary | ICD-10-CM | POA: Diagnosis not present

## 2016-04-28 DIAGNOSIS — O3680X1 Pregnancy with inconclusive fetal viability, fetus 1: Secondary | ICD-10-CM | POA: Diagnosis not present

## 2016-04-28 DIAGNOSIS — Z3A10 10 weeks gestation of pregnancy: Secondary | ICD-10-CM | POA: Diagnosis not present

## 2016-04-28 DIAGNOSIS — Z368A Encounter for antenatal screening for other genetic defects: Secondary | ICD-10-CM | POA: Diagnosis not present

## 2016-05-11 DIAGNOSIS — F17210 Nicotine dependence, cigarettes, uncomplicated: Secondary | ICD-10-CM | POA: Diagnosis not present

## 2016-05-11 DIAGNOSIS — Z3A12 12 weeks gestation of pregnancy: Secondary | ICD-10-CM | POA: Diagnosis not present

## 2016-05-11 DIAGNOSIS — Z3689 Encounter for other specified antenatal screening: Secondary | ICD-10-CM | POA: Diagnosis not present

## 2016-05-11 DIAGNOSIS — Z3682 Encounter for antenatal screening for nuchal translucency: Secondary | ICD-10-CM | POA: Diagnosis not present

## 2016-05-11 DIAGNOSIS — Z113 Encounter for screening for infections with a predominantly sexual mode of transmission: Secondary | ICD-10-CM | POA: Diagnosis not present

## 2016-05-11 DIAGNOSIS — O99331 Smoking (tobacco) complicating pregnancy, first trimester: Secondary | ICD-10-CM | POA: Diagnosis not present

## 2016-06-26 DIAGNOSIS — O99332 Smoking (tobacco) complicating pregnancy, second trimester: Secondary | ICD-10-CM | POA: Diagnosis not present

## 2016-06-26 DIAGNOSIS — Z3A18 18 weeks gestation of pregnancy: Secondary | ICD-10-CM | POA: Diagnosis not present

## 2016-06-26 DIAGNOSIS — Z363 Encounter for antenatal screening for malformations: Secondary | ICD-10-CM | POA: Diagnosis not present

## 2016-07-27 DIAGNOSIS — N390 Urinary tract infection, site not specified: Secondary | ICD-10-CM | POA: Diagnosis not present

## 2016-08-24 DIAGNOSIS — Z3689 Encounter for other specified antenatal screening: Secondary | ICD-10-CM | POA: Diagnosis not present

## 2016-08-24 DIAGNOSIS — Z3A27 27 weeks gestation of pregnancy: Secondary | ICD-10-CM | POA: Diagnosis not present

## 2016-08-24 DIAGNOSIS — Z8632 Personal history of gestational diabetes: Secondary | ICD-10-CM | POA: Diagnosis not present

## 2016-08-24 DIAGNOSIS — Z23 Encounter for immunization: Secondary | ICD-10-CM | POA: Diagnosis not present

## 2016-08-24 DIAGNOSIS — O24410 Gestational diabetes mellitus in pregnancy, diet controlled: Secondary | ICD-10-CM | POA: Diagnosis not present

## 2016-08-24 DIAGNOSIS — O2302 Infections of kidney in pregnancy, second trimester: Secondary | ICD-10-CM | POA: Diagnosis not present

## 2016-08-24 DIAGNOSIS — Z148 Genetic carrier of other disease: Secondary | ICD-10-CM | POA: Diagnosis not present

## 2016-09-05 DIAGNOSIS — R7309 Other abnormal glucose: Secondary | ICD-10-CM | POA: Diagnosis not present

## 2016-10-04 DIAGNOSIS — O3660X0 Maternal care for excessive fetal growth, unspecified trimester, not applicable or unspecified: Secondary | ICD-10-CM | POA: Diagnosis not present

## 2016-10-04 DIAGNOSIS — O409XX0 Polyhydramnios, unspecified trimester, not applicable or unspecified: Secondary | ICD-10-CM | POA: Diagnosis not present

## 2016-10-04 DIAGNOSIS — Z3A33 33 weeks gestation of pregnancy: Secondary | ICD-10-CM | POA: Diagnosis not present

## 2016-10-09 DIAGNOSIS — O403XX0 Polyhydramnios, third trimester, not applicable or unspecified: Secondary | ICD-10-CM | POA: Diagnosis not present

## 2016-10-09 DIAGNOSIS — Z3A33 33 weeks gestation of pregnancy: Secondary | ICD-10-CM | POA: Diagnosis not present

## 2016-10-12 DIAGNOSIS — Z3A34 34 weeks gestation of pregnancy: Secondary | ICD-10-CM | POA: Diagnosis not present

## 2016-10-12 DIAGNOSIS — O403XX0 Polyhydramnios, third trimester, not applicable or unspecified: Secondary | ICD-10-CM | POA: Diagnosis not present

## 2016-10-16 DIAGNOSIS — Z3A34 34 weeks gestation of pregnancy: Secondary | ICD-10-CM | POA: Diagnosis not present

## 2016-10-16 DIAGNOSIS — O403XX0 Polyhydramnios, third trimester, not applicable or unspecified: Secondary | ICD-10-CM | POA: Diagnosis not present

## 2016-10-19 DIAGNOSIS — O403XX0 Polyhydramnios, third trimester, not applicable or unspecified: Secondary | ICD-10-CM | POA: Diagnosis not present

## 2016-10-19 DIAGNOSIS — Z3A35 35 weeks gestation of pregnancy: Secondary | ICD-10-CM | POA: Diagnosis not present

## 2016-10-20 DIAGNOSIS — Z3685 Encounter for antenatal screening for Streptococcus B: Secondary | ICD-10-CM | POA: Diagnosis not present

## 2016-10-20 DIAGNOSIS — N898 Other specified noninflammatory disorders of vagina: Secondary | ICD-10-CM | POA: Diagnosis not present

## 2016-10-20 DIAGNOSIS — Z3A35 35 weeks gestation of pregnancy: Secondary | ICD-10-CM | POA: Diagnosis not present

## 2016-10-20 DIAGNOSIS — O99333 Smoking (tobacco) complicating pregnancy, third trimester: Secondary | ICD-10-CM | POA: Diagnosis not present

## 2016-10-24 DIAGNOSIS — Z3A35 35 weeks gestation of pregnancy: Secondary | ICD-10-CM | POA: Diagnosis not present

## 2016-10-24 DIAGNOSIS — O403XX0 Polyhydramnios, third trimester, not applicable or unspecified: Secondary | ICD-10-CM | POA: Diagnosis not present

## 2016-10-25 DIAGNOSIS — O99330 Smoking (tobacco) complicating pregnancy, unspecified trimester: Secondary | ICD-10-CM | POA: Diagnosis not present

## 2016-10-25 DIAGNOSIS — O409XX0 Polyhydramnios, unspecified trimester, not applicable or unspecified: Secondary | ICD-10-CM | POA: Diagnosis not present

## 2016-10-25 DIAGNOSIS — Z3A36 36 weeks gestation of pregnancy: Secondary | ICD-10-CM | POA: Diagnosis not present

## 2016-10-25 DIAGNOSIS — O09299 Supervision of pregnancy with other poor reproductive or obstetric history, unspecified trimester: Secondary | ICD-10-CM | POA: Diagnosis not present

## 2016-11-22 DIAGNOSIS — O09293 Supervision of pregnancy with other poor reproductive or obstetric history, third trimester: Secondary | ICD-10-CM | POA: Diagnosis not present

## 2016-11-22 DIAGNOSIS — Z23 Encounter for immunization: Secondary | ICD-10-CM | POA: Diagnosis not present

## 2016-11-22 DIAGNOSIS — Z3A40 40 weeks gestation of pregnancy: Secondary | ICD-10-CM | POA: Diagnosis not present

## 2016-11-22 DIAGNOSIS — O99333 Smoking (tobacco) complicating pregnancy, third trimester: Secondary | ICD-10-CM | POA: Diagnosis not present

## 2016-11-23 ENCOUNTER — Encounter (HOSPITAL_COMMUNITY): Payer: Self-pay | Admitting: Anesthesiology

## 2016-11-23 ENCOUNTER — Inpatient Hospital Stay (HOSPITAL_COMMUNITY)
Admission: AD | Admit: 2016-11-23 | Discharge: 2016-11-25 | DRG: 807 | Disposition: A | Discharge status: Home / Self Care | Payer: 59 | Source: Ambulatory Visit | Attending: Obstetrics and Gynecology | Admitting: Obstetrics and Gynecology

## 2016-11-23 ENCOUNTER — Encounter (HOSPITAL_COMMUNITY): Payer: Self-pay

## 2016-11-23 DIAGNOSIS — F17210 Nicotine dependence, cigarettes, uncomplicated: Secondary | ICD-10-CM | POA: Diagnosis present

## 2016-11-23 DIAGNOSIS — F329 Major depressive disorder, single episode, unspecified: Secondary | ICD-10-CM | POA: Diagnosis present

## 2016-11-23 DIAGNOSIS — O99334 Smoking (tobacco) complicating childbirth: Secondary | ICD-10-CM | POA: Diagnosis present

## 2016-11-23 DIAGNOSIS — O99344 Other mental disorders complicating childbirth: Secondary | ICD-10-CM | POA: Diagnosis present

## 2016-11-23 DIAGNOSIS — Z3490 Encounter for supervision of normal pregnancy, unspecified, unspecified trimester: Secondary | ICD-10-CM

## 2016-11-23 DIAGNOSIS — O26893 Other specified pregnancy related conditions, third trimester: Secondary | ICD-10-CM | POA: Diagnosis present

## 2016-11-23 DIAGNOSIS — Z3A40 40 weeks gestation of pregnancy: Secondary | ICD-10-CM | POA: Diagnosis not present

## 2016-11-23 LAB — CBC
HCT: 34.3 % — ABNORMAL LOW (ref 36.0–46.0)
HEMOGLOBIN: 12.2 g/dL (ref 12.0–15.0)
MCH: 29.6 pg (ref 26.0–34.0)
MCHC: 35.6 g/dL (ref 30.0–36.0)
MCV: 83.3 fL (ref 78.0–100.0)
Platelets: 205 10*3/uL (ref 150–400)
RBC: 4.12 MIL/uL (ref 3.87–5.11)
RDW: 13.3 % (ref 11.5–15.5)
WBC: 8.4 10*3/uL (ref 4.0–10.5)

## 2016-11-23 MED ORDER — LIDOCAINE HCL (PF) 1 % IJ SOLN
30.0000 mL | INTRAMUSCULAR | Status: DC | PRN
  Filled 2016-11-23: qty 30

## 2016-11-23 MED ORDER — DOCUSATE SODIUM 100 MG PO CAPS
100.0000 mg | ORAL_CAPSULE | Freq: Two times a day (BID) | ORAL | Status: DC
  Administered 2016-11-24: 100 mg via ORAL
  Filled 2016-11-23: qty 1

## 2016-11-23 MED ORDER — FENTANYL 2.5 MCG/ML BUPIVACAINE 1/10 % EPIDURAL INFUSION (WH - ANES): 14.0000 mL/h | INTRAMUSCULAR | Status: DC | PRN

## 2016-11-23 MED ORDER — COCONUT OIL OIL: 1.0000 "application " | TOPICAL_OIL | Status: DC | PRN

## 2016-11-23 MED ORDER — FENTANYL 2.5 MCG/ML BUPIVACAINE 1/10 % EPIDURAL INFUSION (WH - ANES)
INTRAMUSCULAR | Status: AC
  Filled 2016-11-23: qty 100

## 2016-11-23 MED ORDER — ZOLPIDEM TARTRATE 5 MG PO TABS: 5.0000 mg | ORAL_TABLET | Freq: Every evening | ORAL | Status: DC | PRN

## 2016-11-23 MED ORDER — ONDANSETRON HCL 4 MG PO TABS: 4.0000 mg | ORAL_TABLET | ORAL | Status: DC | PRN

## 2016-11-23 MED ORDER — TERBUTALINE SULFATE 1 MG/ML IJ SOLN
0.2500 mg | Freq: Once | INTRAMUSCULAR | Status: DC | PRN
  Filled 2016-11-23: qty 1

## 2016-11-23 MED ORDER — PHENYLEPHRINE 40 MCG/ML (10ML) SYRINGE FOR IV PUSH (FOR BLOOD PRESSURE SUPPORT): 80.0000 ug | PREFILLED_SYRINGE | INTRAVENOUS | Status: DC | PRN

## 2016-11-23 MED ORDER — EPHEDRINE 5 MG/ML INJ: 10.0000 mg | INTRAVENOUS | Status: DC | PRN

## 2016-11-23 MED ORDER — OXYCODONE-ACETAMINOPHEN 5-325 MG PO TABS: 1.0000 | ORAL_TABLET | ORAL | Status: DC | PRN

## 2016-11-23 MED ORDER — PHENYLEPHRINE 40 MCG/ML (10ML) SYRINGE FOR IV PUSH (FOR BLOOD PRESSURE SUPPORT)
80.0000 ug | PREFILLED_SYRINGE | INTRAVENOUS | Status: DC | PRN
  Filled 2016-11-23: qty 5

## 2016-11-23 MED ORDER — SOD CITRATE-CITRIC ACID 500-334 MG/5ML PO SOLN: 30.0000 mL | ORAL | Status: DC | PRN

## 2016-11-23 MED ORDER — OXYTOCIN 10 UNIT/ML IJ SOLN
INTRAMUSCULAR | Status: AC
  Filled 2016-11-23: qty 1

## 2016-11-23 MED ORDER — LACTATED RINGERS IV SOLN
INTRAVENOUS | Status: DC
  Administered 2016-11-23: 125 mL via INTRAVENOUS

## 2016-11-23 MED ORDER — PRENATAL MULTIVITAMIN CH
1.0000 | ORAL_TABLET | Freq: Every day | ORAL | Status: DC
  Administered 2016-11-24 – 2016-11-25 (×2): 1 via ORAL
  Filled 2016-11-23 (×2): qty 1

## 2016-11-23 MED ORDER — OXYTOCIN BOLUS FROM INFUSION: 500.0000 mL | Freq: Once | INTRAVENOUS | Status: DC

## 2016-11-23 MED ORDER — PHENYLEPHRINE 40 MCG/ML (10ML) SYRINGE FOR IV PUSH (FOR BLOOD PRESSURE SUPPORT)
PREFILLED_SYRINGE | INTRAVENOUS | Status: AC
  Filled 2016-11-23: qty 10

## 2016-11-23 MED ORDER — DIPHENHYDRAMINE HCL 50 MG/ML IJ SOLN: 12.5000 mg | INTRAMUSCULAR | Status: DC | PRN

## 2016-11-23 MED ORDER — EPHEDRINE 5 MG/ML INJ
10.0000 mg | INTRAVENOUS | Status: DC | PRN
  Filled 2016-11-23: qty 2

## 2016-11-23 MED ORDER — ACETAMINOPHEN 325 MG PO TABS
650.0000 mg | ORAL_TABLET | ORAL | Status: DC | PRN
  Administered 2016-11-23 – 2016-11-24 (×2): 650 mg via ORAL
  Filled 2016-11-23 (×3): qty 2

## 2016-11-23 MED ORDER — ACETAMINOPHEN 325 MG PO TABS: 650.0000 mg | ORAL_TABLET | ORAL | Status: DC | PRN

## 2016-11-23 MED ORDER — TETANUS-DIPHTH-ACELL PERTUSSIS 5-2.5-18.5 LF-MCG/0.5 IM SUSP: 0.5000 mL | Freq: Once | INTRAMUSCULAR | Status: DC

## 2016-11-23 MED ORDER — LACTATED RINGERS IV SOLN: 500.0000 mL | Freq: Once | INTRAVENOUS | Status: DC

## 2016-11-23 MED ORDER — SENNOSIDES-DOCUSATE SODIUM 8.6-50 MG PO TABS
2.0000 | ORAL_TABLET | ORAL | Status: DC
  Administered 2016-11-23: 2 via ORAL
  Filled 2016-11-23 (×2): qty 2

## 2016-11-23 MED ORDER — DIBUCAINE 1 % RE OINT: 1.0000 "application " | TOPICAL_OINTMENT | RECTAL | Status: DC | PRN

## 2016-11-23 MED ORDER — LACTATED RINGERS IV SOLN: 500.0000 mL | INTRAVENOUS | Status: DC | PRN

## 2016-11-23 MED ORDER — OXYTOCIN 10 UNIT/ML IJ SOLN
10.0000 [IU] | Freq: Once | INTRAMUSCULAR | Status: AC
  Administered 2016-11-23: 10 [IU] via INTRAMUSCULAR

## 2016-11-23 MED ORDER — IBUPROFEN 600 MG PO TABS
600.0000 mg | ORAL_TABLET | Freq: Four times a day (QID) | ORAL | Status: DC
  Administered 2016-11-23 – 2016-11-25 (×7): 600 mg via ORAL
  Filled 2016-11-23 (×7): qty 1

## 2016-11-23 MED ORDER — SIMETHICONE 80 MG PO CHEW: 80.0000 mg | CHEWABLE_TABLET | ORAL | Status: DC | PRN

## 2016-11-23 MED ORDER — ONDANSETRON HCL 4 MG/2ML IJ SOLN: 4.0000 mg | INTRAMUSCULAR | Status: DC | PRN

## 2016-11-23 MED ORDER — ONDANSETRON HCL 4 MG/2ML IJ SOLN: 4.0000 mg | Freq: Four times a day (QID) | INTRAMUSCULAR | Status: DC | PRN

## 2016-11-23 MED ORDER — DIPHENHYDRAMINE HCL 25 MG PO CAPS: 25.0000 mg | ORAL_CAPSULE | Freq: Four times a day (QID) | ORAL | Status: DC | PRN

## 2016-11-23 MED ORDER — OXYTOCIN 40 UNITS IN LACTATED RINGERS INFUSION - SIMPLE MED
2.5000 [IU]/h | INTRAVENOUS | Status: DC
  Filled 2016-11-23: qty 1000

## 2016-11-23 MED ORDER — BENZOCAINE-MENTHOL 20-0.5 % EX AERO: 1.0000 "application " | INHALATION_SPRAY | CUTANEOUS | Status: DC | PRN

## 2016-11-23 MED ORDER — OXYCODONE-ACETAMINOPHEN 5-325 MG PO TABS: 2.0000 | ORAL_TABLET | ORAL | Status: DC | PRN

## 2016-11-23 MED ORDER — WITCH HAZEL-GLYCERIN EX PADS: 1.0000 "application " | MEDICATED_PAD | CUTANEOUS | Status: DC | PRN

## 2016-11-23 NOTE — H&P (Signed)
Rhonda Mullins is a 31 y.o. female presenting for painful contractions/active labor. Pt was 8cm on arrival at [facility]. Pt was dated by a 10week [place]; she had a short interval from last full term delivery till this pregnancy.  Hx GDM and gHTN  but none with this pregnancy. She was monitored for polyhydramnios which resolved. Depression controlled on zoloft. First trimester screen neg. 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 GBS neg OB History    Gravida Para Term Preterm AB Living   0 2 1   SAB TAB Ectopic Multiple Live Births   0 1 1 0 1     Past Medical History:  Diagnosis Date  . Asthma    allergy related   No past surgical history on file. Family History: family history includes Hypertension in her mother. Social History:  reports that she has been smoking Cigarettes.  She has a 1.00 pack-year smoking history. She does not have any smokeless tobacco history on file. She reports that she does not drink alcohol or use drugs.     Maternal Diabetes: No Genetic Screening: Abnormal:  Results: Other:+ sma carrier but FOB neg Maternal Ultrasounds/Referrals: Normal- polyhydramnious resolved Fetal Ultrasounds or other Referrals:  None Maternal Substance Abuse:  Yes:  Type: Smoker Significant Maternal Medications:  Meds include: Zoloft Significant Maternal Lab Results:  Lab values include: Group B Strep negative Other Comments:  None  Review of Systems  Constitutional: Positive for malaise/fatigue. Negative for chills, fever and weight loss.  Eyes: Negative for blurred vision.  Respiratory: Positive for shortness of breath.   Cardiovascular: Negative for chest pain.  Gastrointestinal: Positive for abdominal pain. Negative for heartburn, nausea and vomiting.  Genitourinary: Negative for dysuria.  Musculoskeletal: Positive for back pain and myalgias.  Skin: Negative for itching and rash.  Neurological: Negative for dizziness, tingling and headaches.  Endo/Heme/Allergies: Does not bruise/bleed  easily.  Psychiatric/Behavioral: Negative for depression, hallucinations, substance abuse and suicidal ideas. The patient is nervous/anxious.    Maternal Medical History:  Reason for admission: Contractions.  Nausea.  Contractions: Onset was 3-5 hours ago.   Frequency: regular.   Perceived severity is strong.    Fetal activity: Perceived fetal activity is normal.   Last perceived fetal movement was within the past hour.    Prenatal complications: Polyhydramnios (resolved).   Prenatal Complications - Diabetes: none.    Dilation: 8 Exam by:: Eliot Ford, RN Blood pressure (!) 162/93, pulse 86, unknown if currently breastfeeding. Maternal Exam:  Uterine Assessment: Contraction strength is firm.  Contraction frequency is regular.   Abdomen: Patient reports generalized tenderness.  Estimated fetal weight is AGA.   Fetal presentation: vertex  Introitus: Normal vulva. Vulva is negative for condylomata and lesion.  Normal vagina.  Vagina is negative for condylomata and discharge.  Pelvis: adequate for delivery.   Cervix: Cervix evaluated by digital exam.     Physical Exam  Constitutional: She appears well-developed and well-nourished.  HENT:  Head: Normocephalic.  Neck: Normal range of motion.  Cardiovascular: Normal rate.   Respiratory: Effort normal.  GI: Soft. There is generalized tenderness.  Genitourinary: Vagina normal. Rectal exam shows guaiac positive stool. Vulva exhibits no lesion. No vaginal discharge found.  Musculoskeletal: Normal range of motion.  Neurological: She is alert.  Skin: Skin is warm.  Psychiatric: She has a normal mood and affect. Her behavior is normal. Judgment and thought content normal.    Prenatal labs: ABO, Rh:   Antibody:   Rubella: @  RUBELLARESULTSCONSOLE@ RPR:    HBsAg:    HIV:    GBS:     Assessment/Plan: [id number] at 40 1/7wks in active labor Will AROM Pain control with nitrous Anticipate svd   Rhonda Mullins  Rhonda Mullins 11/23/2016, 7:02 PM

## 2016-11-23 NOTE — MAU Note (Signed)
PT having ctx starting about an hour ago. Pt's husband states she was checked in the office yesterday and was 6cm.

## 2016-11-23 NOTE — Anesthesia Preprocedure Evaluation (Deleted)
Anesthesia Evaluation  Patient identified by MRN, date of birth, ID band Patient awake    Reviewed: Allergy & Precautions, H&P , NPO status , Patient's Chart, lab work & pertinent test results  Airway Mallampati: II   Neck ROM: full    Dental   Pulmonary asthma , Current Smoker,    breath sounds clear to auscultation       Cardiovascular negative cardio ROS   Rhythm:regular Rate:Normal     Neuro/Psych    GI/Hepatic   Endo/Other    Renal/GU      Musculoskeletal   Abdominal   Peds  Hematology   Anesthesia Other Findings   Reproductive/Obstetrics (+) Pregnancy                             Anesthesia Physical Anesthesia Plan  ASA: II  Anesthesia Plan: Epidural   Post-op Pain Management:    Induction: Intravenous  PONV Risk Score and Plan: 1 and Treatment may vary due to age or medical condition  Airway Management Planned: Natural Airway  Additional Equipment:   Intra-op Plan:   Post-operative Plan:   Informed Consent: I have reviewed the patients History and Physical, chart, labs and discussed the procedure including the risks, benefits and alternatives for the proposed anesthesia with the patient or authorized representative who has indicated his/her understanding and acceptance.     Plan Discussed with: Anesthesiologist  Anesthesia Plan Comments:         Anesthesia Quick Evaluation

## 2016-11-24 LAB — CBC
HCT: 29.4 % — ABNORMAL LOW (ref 36.0–46.0)
HEMOGLOBIN: 10.3 g/dL — AB (ref 12.0–15.0)
MCH: 29.3 pg (ref 26.0–34.0)
MCHC: 35 g/dL (ref 30.0–36.0)
MCV: 83.5 fL (ref 78.0–100.0)
Platelets: 215 10*3/uL (ref 150–400)
RBC: 3.52 MIL/uL — ABNORMAL LOW (ref 3.87–5.11)
RDW: 13.4 % (ref 11.5–15.5)
WBC: 13.4 10*3/uL — ABNORMAL HIGH (ref 4.0–10.5)

## 2016-11-24 LAB — RPR: RPR: NONREACTIVE

## 2016-11-24 NOTE — Progress Notes (Signed)
Post Partum Day 1 Subjective: no complaints, up ad lib and tolerating PO   Requesting early d/c this PM, even if after 1000pm  Objective: Blood pressure 122/78, pulse (!) 59, temperature 97.6 F (36.4 C), temperature source Axillary, resp. rate 16, height  (1.676 m), weight 96.6 kg (213 lb), SpO2 100 %, unknown if currently breastfeeding.  Physical Exam:  General: alert and cooperative Lochia: appropriate Uterine Fundus: firm    Recent Labs  11/23/16 1906 11/24/16 0439  HGB 12.2 10.3*  HCT 34.3* 29.4*    Assessment/Plan: Advised pt to see if pediatrician willing for baby to be d/c this PM and if so will d/c her as well.   LOS: 1 day   Oliver Pila 11/24/2016, 10:33 AM

## 2016-11-25 LAB — TYPE AND SCREEN
ABO/RH(D): B POS
Antibody Screen: POSITIVE
DAT, IGG: NEGATIVE
PT AG Type: NEGATIVE
UNIT DIVISION: 0
Unit division: 0

## 2016-11-25 LAB — BPAM RBC
BLOOD PRODUCT EXPIRATION DATE: 201810312359
Blood Product Expiration Date: 201810302359
UNIT TYPE AND RH: 5100
Unit Type and Rh: 5100

## 2016-11-25 MED ORDER — ACETAMINOPHEN 325 MG PO TABS: 650.0000 mg | ORAL_TABLET | ORAL | 0 refills | Status: AC | PRN

## 2016-11-25 MED ORDER — IBUPROFEN 600 MG PO TABS: 600.0000 mg | ORAL_TABLET | Freq: Four times a day (QID) | ORAL | 0 refills | Status: DC

## 2016-11-25 NOTE — Progress Notes (Signed)
Post Partum Day 2 Subjective: no complaints, up ad lib and tolerating PO Ready for d/c  Objective: Blood pressure (!) 119/55, pulse 72, temperature 98 F (36.7 C), temperature source Oral, resp. rate 16, height  (1.676 m), weight 96.6 kg (213 lb), SpO2 100 %, unknown if currently breastfeeding.  Physical Exam:  General: alert and cooperative Lochia: appropriate Uterine Fundus: firm    Recent Labs  11/23/16 1906 11/24/16 0439  HGB 12.2 10.3*  HCT 34.3* 29.4*    Assessment/Plan: Discharge home   LOS: 2 days   Rhonda Mullins 11/25/2016, 10:56 AM

## 2016-11-25 NOTE — Lactation Note (Addendum)
This note was copied from a baby's chart. Lactation Consultation Note  Patient Name: Rhonda Mullins [id number] Date: 11/25/2016 Reason for consult: Initial assessment;Nipple pain/trauma  Baby 39 hours old. Mom reports that her nipples are becoming sore. Baby cueing to nurse, so mom latched baby in cradle position. Assisted mom to latch baby in cross-cradle position to achieve a deeper latch and mom reported increased comfort. Enc mom to use EBM on nipples for healing. Mom states that she had mastitis with last child. Enc making sure baby softening breasts while nursing, and massaging and hand expressing for comfort if breasts become engorged.   Mom a [employer] employee, so has this LC's extension to call in order to obtain employee pump as soon as she knows she is being D/C'd.   Mom given an Geneticist, molecular.  Maternal Data Has patient been taught Hand Expression?: Yes Does the patient have breastfeeding experience prior to this delivery?: Yes  Feeding Feeding Type: Breast Fed  LATCH Score Latch: Grasps breast easily, tongue down, lips flanged, rhythmical sucking.  Audible Swallowing: Spontaneous and intermittent  Type of Nipple: Everted at rest and after stimulation  Comfort (Breast/Nipple): Filling, red/small blisters or bruises, mild/mod discomfort  Hold (Positioning): Assistance needed to correctly position infant at breast and maintain latch.  LATCH Score: 8  Interventions Interventions: Assisted with latch;Adjust position;Expressed milk  Lactation Tools Discussed/Used     Consult Status Consult Status: PRN    Sherlyn Hay 11/25/2016, 12:22 PM

## 2016-11-25 NOTE — Discharge Summary (Signed)
OB Discharge Summary     Patient Name: Rhonda Mullins DOB: Mar 11, 1985 MRN: [id number]  Date of admission: 11/23/2016 Delivering MD: Pryor Ochoa [facility]   Date of discharge: 11/25/2016  Admitting diagnosis: 40.1wks CTX Continuous Intrauterine pregnancy: [redacted]w[redacted]d     Secondary diagnosis:  Active Problems:   Postpartum care following vaginal delivery   Term pregnancy  Additional problems: none     Discharge diagnosis: Term Pregnancy Delivered                                                                                                Post partum procedures:none  Augmentation: AROM  Complications: None  Hospital course:  Onset of Labor With Vaginal Delivery     31 y.o. yo [id number] at [redacted]w[redacted]d was admitted in Active Labor on 11/23/2016. Patient had an uncomplicated labor course as follows:  Membrane Rupture Time/Date: 7:14 PM ,11/23/2016   Intrapartum Procedures: Episiotomy: None [1]                                         Lacerations:  Periurethral [8]  Patient had a delivery of a Viable infant. 11/23/2016  Information for the patient's newborn:  Hedy, Garro [[id number]]  Delivery Method: Vag-Spont    Pateint had an uncomplicated postpartum course.  She is ambulating, tolerating a regular diet, passing flatus, and urinating well. Patient is discharged home in stable condition on 11/25/16.   Physical exam  Vitals:   11/24/16 0342 11/24/16 0924 11/24/16 1807 11/25/16 0620  BP: 122/66 122/78 119/64 (!) 119/55  Pulse: 68 (!) 59 75 72  Resp: Temp: 98.5 F (36.9 C) 97.6 F (36.4 C) 98.3 F (36.8 C) 98 F (36.7 C)  TempSrc: Oral Axillary Oral Oral  SpO2:  100%    Weight:      Height:       General: alert and cooperative Lochia: appropriate Uterine Fundus: firm  Labs: Lab Results  Component Value Date   WBC 13.4 (H) 11/24/2016   HGB 10.3 (L) 11/24/2016   HCT 29.4 (L) 11/24/2016   MCV 83.5 11/24/2016   PLT 215 11/24/2016   No flowsheet data  found.  Discharge instruction: per After Visit Summary and "Baby and Me Booklet".  After visit meds:  Allergies as of 11/25/2016   No Known Allergies     Medication List    STOP taking these medications   diphenhydramine-acetaminophen 25-500 MG Tabs tablet Commonly known as:  TYLENOL PM     TAKE these medications   acetaminophen 325 MG tablet Commonly known as:  TYLENOL Take 2 tablets (650 mg total) by mouth every 4 (four) hours as needed (for pain scale < 4).   albuterol 108 (90 Base) MCG/ACT inhaler Commonly known as:  PROVENTIL HFA;VENTOLIN HFA Inhale 2 puffs into the lungs every 6 (six) hours as needed for wheezing or shortness of breath.   ibuprofen 600 MG tablet Commonly known as:  ADVIL,MOTRIN Take 1 tablet (600 mg total) by  mouth every 6 (six) hours.   prenatal multivitamin Tabs tablet Take 1 tablet by mouth daily at 12 noon.   sertraline 50 MG tablet Commonly known as:  ZOLOFT Take 50 mg by mouth daily.       Diet: routine diet  Activity: Advance as tolerated. Pelvic rest for 6 weeks.   Outpatient follow up:6 weeks Follow up Appt:No future appointments. Follow up Visit:No Follow-up on file.  Postpartum contraception: Undecided  Newborn Data: Live born female  Birth Weight: 7 lb 5.6 oz (3334 g) APGAR: 9, 9  Newborn Delivery   Birth date/time:  11/23/2016 20:43:00 Delivery type:  Vaginal, Spontaneous Delivery      Baby Feeding: Breast Disposition:home with mother   11/25/2016 Oliver Pila, MD

## 2016-11-25 NOTE — Progress Notes (Signed)
MOB was referred for history of depression/anxiety.  Referral is screened out by Clinical Social Worker because none of the following criteria appear to apply and there are no reports impacting the pregnancy or her transition to the postpartum period. CSW does not deem it clinically necessary to further investigate at this time.  -History of anxiety/depression during this pregnancy, or of post-partum depression.  - Diagnosis of anxiety and/or depression within last 3 years.-  - History of depression due to pregnancy loss/loss of child or -MOB's symptoms are currently being treated with medication and/or therapy. (ZOloft)   Please contact the Clinical Social Worker if needs arise or upon MOB request.   Elnita Surprenant, MSW, LCSW-A Clinical Social Worker  [facility]  Office: [phone]   

## 2016-12-07 DIAGNOSIS — O403XX0 Polyhydramnios, third trimester, not applicable or unspecified: Secondary | ICD-10-CM | POA: Diagnosis not present

## 2016-12-07 DIAGNOSIS — Z7689 Persons encountering health services in other specified circumstances: Secondary | ICD-10-CM | POA: Diagnosis not present

## 2017-01-05 DIAGNOSIS — Z1389 Encounter for screening for other disorder: Secondary | ICD-10-CM | POA: Diagnosis not present

## 2017-01-05 DIAGNOSIS — Z113 Encounter for screening for infections with a predominantly sexual mode of transmission: Secondary | ICD-10-CM | POA: Diagnosis not present

## 2017-01-05 DIAGNOSIS — Z3009 Encounter for other general counseling and advice on contraception: Secondary | ICD-10-CM | POA: Diagnosis not present

## 2017-10-13 ENCOUNTER — Inpatient Hospital Stay (HOSPITAL_BASED_OUTPATIENT_CLINIC_OR_DEPARTMENT_OTHER)
Admission: AD | Admit: 2017-10-13 | Discharge: 2017-10-14 | Disposition: A | Discharge status: Other Acute Inpatient Hospital | Payer: 59 | Attending: Emergency Medicine | Admitting: Emergency Medicine

## 2017-10-13 ENCOUNTER — Encounter (HOSPITAL_BASED_OUTPATIENT_CLINIC_OR_DEPARTMENT_OTHER): Payer: Self-pay

## 2017-10-13 ENCOUNTER — Other Ambulatory Visit: Payer: Self-pay

## 2017-10-13 DIAGNOSIS — O2341 Unspecified infection of urinary tract in pregnancy, first trimester: Secondary | ICD-10-CM | POA: Insufficient documentation

## 2017-10-13 DIAGNOSIS — O468X1 Other antepartum hemorrhage, first trimester: Secondary | ICD-10-CM | POA: Diagnosis not present

## 2017-10-13 DIAGNOSIS — R102 Pelvic and perineal pain: Secondary | ICD-10-CM | POA: Insufficient documentation

## 2017-10-13 DIAGNOSIS — Z3201 Encounter for pregnancy test, result positive: Secondary | ICD-10-CM | POA: Insufficient documentation

## 2017-10-13 DIAGNOSIS — Z3491 Encounter for supervision of normal pregnancy, unspecified, first trimester: Secondary | ICD-10-CM | POA: Insufficient documentation

## 2017-10-13 DIAGNOSIS — Z3A00 Weeks of gestation of pregnancy not specified: Secondary | ICD-10-CM | POA: Diagnosis not present

## 2017-10-13 DIAGNOSIS — Z79899 Other long term (current) drug therapy: Secondary | ICD-10-CM | POA: Insufficient documentation

## 2017-10-13 DIAGNOSIS — J45909 Unspecified asthma, uncomplicated: Secondary | ICD-10-CM | POA: Diagnosis not present

## 2017-10-13 DIAGNOSIS — Z3A01 Less than 8 weeks gestation of pregnancy: Secondary | ICD-10-CM | POA: Diagnosis not present

## 2017-10-13 DIAGNOSIS — O131 Gestational [pregnancy-induced] hypertension without significant proteinuria, first trimester: Secondary | ICD-10-CM | POA: Diagnosis not present

## 2017-10-13 DIAGNOSIS — N83209 Unspecified ovarian cyst, unspecified side: Secondary | ICD-10-CM | POA: Insufficient documentation

## 2017-10-13 DIAGNOSIS — O2301 Infections of kidney in pregnancy, first trimester: Secondary | ICD-10-CM | POA: Insufficient documentation

## 2017-10-13 DIAGNOSIS — O3481 Maternal care for other abnormalities of pelvic organs, first trimester: Secondary | ICD-10-CM | POA: Insufficient documentation

## 2017-10-13 DIAGNOSIS — O9989 Other specified diseases and conditions complicating pregnancy, childbirth and the puerperium: Secondary | ICD-10-CM | POA: Insufficient documentation

## 2017-10-13 DIAGNOSIS — R109 Unspecified abdominal pain: Secondary | ICD-10-CM | POA: Diagnosis not present

## 2017-10-13 DIAGNOSIS — O99511 Diseases of the respiratory system complicating pregnancy, first trimester: Secondary | ICD-10-CM | POA: Insufficient documentation

## 2017-10-13 DIAGNOSIS — O99331 Smoking (tobacco) complicating pregnancy, first trimester: Secondary | ICD-10-CM | POA: Insufficient documentation

## 2017-10-13 DIAGNOSIS — F17210 Nicotine dependence, cigarettes, uncomplicated: Secondary | ICD-10-CM | POA: Insufficient documentation

## 2017-10-13 DIAGNOSIS — O368919 Maternal care for other specified fetal problems, first trimester, other fetus: Secondary | ICD-10-CM | POA: Diagnosis not present

## 2017-10-13 DIAGNOSIS — O208 Other hemorrhage in early pregnancy: Secondary | ICD-10-CM | POA: Diagnosis not present

## 2017-10-13 DIAGNOSIS — O161 Unspecified maternal hypertension, first trimester: Secondary | ICD-10-CM | POA: Diagnosis not present

## 2017-10-13 DIAGNOSIS — R509 Fever, unspecified: Secondary | ICD-10-CM

## 2017-10-13 DIAGNOSIS — O418X10 Other specified disorders of amniotic fluid and membranes, first trimester, not applicable or unspecified: Secondary | ICD-10-CM | POA: Insufficient documentation

## 2017-10-13 DIAGNOSIS — O26891 Other specified pregnancy related conditions, first trimester: Secondary | ICD-10-CM

## 2017-10-13 LAB — CBC WITH DIFFERENTIAL/PLATELET
Basophils Absolute: 0 10*3/uL (ref 0.0–0.1)
Basophils Relative: 0 %
EOS PCT: 1 %
Eosinophils Absolute: 0.1 10*3/uL (ref 0.0–0.7)
HCT: 37.6 % (ref 36.0–46.0)
Hemoglobin: 12.9 g/dL (ref 12.0–15.0)
LYMPHS ABS: 1.3 10*3/uL (ref 0.7–4.0)
LYMPHS PCT: 21 %
MCH: 29.1 pg (ref 26.0–34.0)
MCHC: 34.3 g/dL (ref 30.0–36.0)
MCV: 84.7 fL (ref 78.0–100.0)
MONO ABS: 0.7 10*3/uL (ref 0.1–1.0)
MONOS PCT: 11 %
Neutro Abs: 4.1 10*3/uL (ref 1.7–7.7)
Neutrophils Relative %: 67 %
PLATELETS: 238 10*3/uL (ref 150–400)
RBC: 4.44 MIL/uL (ref 3.87–5.11)
RDW: 13.3 % (ref 11.5–15.5)
WBC: 6.1 10*3/uL (ref 4.0–10.5)

## 2017-10-13 LAB — PREGNANCY, URINE: PREG TEST UR: POSITIVE — AB

## 2017-10-13 LAB — URINALYSIS, ROUTINE W REFLEX MICROSCOPIC
BILIRUBIN URINE: NEGATIVE
Glucose, UA: NEGATIVE mg/dL
HGB URINE DIPSTICK: NEGATIVE
Ketones, ur: NEGATIVE mg/dL
Leukocytes, UA: NEGATIVE
Nitrite: POSITIVE — AB
PROTEIN: NEGATIVE mg/dL
pH: 5.5 (ref 5.0–8.0)

## 2017-10-13 LAB — COMPREHENSIVE METABOLIC PANEL
ALT: 19 U/L (ref 0–44)
ANION GAP: 10 (ref 5–15)
AST: 20 U/L (ref 15–41)
Albumin: 4.5 g/dL (ref 3.5–5.0)
Alkaline Phosphatase: 53 U/L (ref 38–126)
BUN: 6 mg/dL (ref 6–20)
CHLORIDE: 101 mmol/L (ref 98–111)
CO2: 22 mmol/L (ref 22–32)
CREATININE: 0.51 mg/dL (ref 0.44–1.00)
Calcium: 9.3 mg/dL (ref 8.9–10.3)
Glucose, Bld: 92 mg/dL (ref 70–99)
POTASSIUM: 3.4 mmol/L — AB (ref 3.5–5.1)
SODIUM: 133 mmol/L — AB (ref 135–145)
Total Bilirubin: 0.8 mg/dL (ref 0.3–1.2)
Total Protein: 8.8 g/dL — ABNORMAL HIGH (ref 6.5–8.1)

## 2017-10-13 LAB — WET PREP, GENITAL
Clue Cells Wet Prep HPF POC: NONE SEEN
SPERM: NONE SEEN
TRICH WET PREP: NONE SEEN
YEAST WET PREP: NONE SEEN

## 2017-10-13 LAB — LIPASE, BLOOD: Lipase: 27 U/L (ref 11–51)

## 2017-10-13 LAB — URINALYSIS, MICROSCOPIC (REFLEX)

## 2017-10-13 MED ORDER — ACETAMINOPHEN 325 MG PO TABS
650.0000 mg | ORAL_TABLET | Freq: Once | ORAL | Status: AC
  Administered 2017-10-13: 650 mg via ORAL
  Filled 2017-10-13: qty 2

## 2017-10-13 MED ORDER — SODIUM CHLORIDE 0.9 % IV SOLN
INTRAVENOUS | Status: DC
  Administered 2017-10-13: 23:00:00 via INTRAVENOUS

## 2017-10-13 MED ORDER — SODIUM CHLORIDE 0.9 % IV SOLN
1.0000 g | Freq: Once | INTRAVENOUS | Status: AC
  Administered 2017-10-14: 1 g via INTRAVENOUS
  Filled 2017-10-13: qty 10

## 2017-10-13 MED ORDER — SODIUM CHLORIDE 0.9 % IV BOLUS
1000.0000 mL | Freq: Once | INTRAVENOUS | Status: AC
  Administered 2017-10-13: 1000 mL via INTRAVENOUS

## 2017-10-13 MED ORDER — ONDANSETRON HCL 4 MG/2ML IJ SOLN
4.0000 mg | Freq: Once | INTRAMUSCULAR | Status: AC
  Administered 2017-10-13: 4 mg via INTRAVENOUS
  Filled 2017-10-13: qty 2

## 2017-10-13 NOTE — ED Provider Notes (Signed)
[facility] EMERGENCY DEPARTMENT Provider Note   CSN: [id number] Arrival date & time: 10/13/17  2014     History   Chief Complaint Chief Complaint  Patient presents with  . Abdominal Pain    HPI Dessa Phienarka Harpster is a 32 y.o. female.  Patient is a 32 year old [id number] whose last menstrual period was on 08/28/2017 with a positive pregnancy test at home is presenting today with 2 days of worsening general malaise, fever, nausea, vomiting and lower abdominal pain.  Patient states earlier this week she had some clear pink discharge but no vaginal bleeding.  She did not start feeling badly until yesterday.  She has not seen OB/GYN yet but has an appointment in September.  She does have history of having hypertension towards the end of her pregnancy but no baseline hypertension.  She has been sexually active with the same partner for 10 years without history of STI.  She denies any cough, congestion or shortness of breath.  She states she was vomiting all night last night and has not been able to keep much down today.  The history is provided by the patient.  Abdominal Pain   This is a new problem. The current episode started 2 days ago. The problem occurs constantly. The problem has been gradually worsening. The pain is associated with an unknown factor. The pain is located in the suprapubic region. The quality of the pain is throbbing. The pain is at a severity of 7/10. The pain is moderate. Associated symptoms include anorexia, fever, nausea and vomiting. Pertinent negatives include diarrhea, dysuria and frequency. Associated symptoms comments: Clear vaginal discharge..    Past Medical History:  Diagnosis Date  . Asthma    allergy related    Patient Active Problem List   Diagnosis Date Noted  . Term pregnancy 11/23/2016  . Pregnancy 08/14/2015  . SVD (spontaneous vaginal delivery) 08/14/2015  . Postpartum care following vaginal delivery 08/14/2015    History reviewed. No  pertinent surgical history.   OB History    Gravida  6   Para  4   Term  4   Preterm  0   AB  2   Living  4     SAB  0   TAB  1   Ectopic  1   Multiple  0   Live Births  4            Home Medications    Prior to Admission medications   Medication Sig Start Date End Date Taking? Authorizing Provider  Prenatal Vit-Fe Fumarate-FA (PRENATAL MULTIVITAMIN) TABS tablet Take 1 tablet by mouth daily at 12 noon.    Yes [provider]  sertraline (ZOLOFT) 50 MG tablet Take 50 mg by mouth daily.   Yes [provider]  acetaminophen (TYLENOL) 325 MG tablet Take 2 tablets (650 mg total) by mouth every 4 (four) hours as needed (for pain scale < 4). 11/25/16   Huel Coteichardson, Kathy, MD  albuterol (PROVENTIL HFA;VENTOLIN HFA) 108 (90 Base) MCG/ACT inhaler Inhale 2 puffs into the lungs every 6 (six) hours as needed for wheezing or shortness of breath.    [provider]  ibuprofen (ADVIL,MOTRIN) 600 MG tablet Take 1 tablet (600 mg total) by mouth every 6 (six) hours. 11/25/16   Huel Coteichardson, Kathy, MD    Family History Family History  Problem Relation Age of Onset  . Hypertension Mother     Social History Social History   Tobacco Use  . Smoking status:  Current Every Day Smoker    Packs/day: 0.50    Years: 2.00    Pack years: 1.00    Types: Cigarettes  . Smokeless tobacco: Never Used  Substance Use Topics  . Alcohol use: No    Alcohol/week: 0.0 standard drinks  . Drug use: No     Allergies   Patient has no known allergies.   Review of Systems Review of Systems  Constitutional: Positive for fever.  Gastrointestinal: Positive for abdominal pain, anorexia, nausea and vomiting. Negative for diarrhea.  Genitourinary: Negative for dysuria and frequency.  All other systems reviewed and are negative.    Physical Exam Updated Vital Signs BP 134/85 (BP Location: Left Arm)   Pulse 96   Temp 99.1 F (37.3 C) (Oral)   Resp 18   Ht 5' 6.5"  (1.689 m)   Wt 79.4 kg   LMP 08/28/2017   SpO2 100%   BMI 27.83 kg/m   Physical Exam  Constitutional: She is oriented to person, place, and time. She appears well-developed and well-nourished. No distress.  Hot to touch  HENT:  Head: Normocephalic and atraumatic.  Mouth/Throat: Oropharynx is clear and moist.  Dry mucous membranes  Eyes: Pupils are equal, round, and reactive to light. Conjunctivae and EOM are normal.  Neck: Normal range of motion. Neck supple.  Cardiovascular: Normal rate, regular rhythm and intact distal pulses.  No murmur heard. Pulmonary/Chest: Effort normal and breath sounds normal. No respiratory distress. She has no wheezes. She has no rales.  Abdominal: Soft. She exhibits no distension. There is tenderness in the suprapubic area. There is no rebound, no guarding and no CVA tenderness.  Genitourinary: Vagina normal. Uterus is enlarged. Cervix exhibits discharge. Cervix exhibits no motion tenderness. Right adnexum displays no tenderness. Left adnexum displays no tenderness.  Genitourinary Comments: Minimal normal white discharge  Musculoskeletal: Normal range of motion. She exhibits no edema or tenderness.  Neurological: She is alert and oriented to person, place, and time.  Skin: Skin is warm and dry. No rash noted. No erythema.  Psychiatric: She has a normal mood and affect. Her behavior is normal.  Nursing note and vitals reviewed.    ED Treatments / Results  Labs (all labs ordered are listed, but only abnormal results are displayed) Labs Reviewed  WET PREP, GENITAL - Abnormal; Notable for the following components:      Result Value   WBC, Wet Prep HPF POC FEW (*)    All other components within normal limits  PREGNANCY, URINE - Abnormal; Notable for the following components:   Preg Test, Ur POSITIVE (*)    All other components within normal limits  URINALYSIS, ROUTINE W REFLEX MICROSCOPIC - Abnormal; Notable for the following components:   Specific  Gravity, Urine <1.005 (*)    Nitrite POSITIVE (*)    All other components within normal limits  URINALYSIS, MICROSCOPIC (REFLEX) - Abnormal; Notable for the following components:   Bacteria, UA MANY (*)    All other components within normal limits  COMPREHENSIVE METABOLIC PANEL - Abnormal; Notable for the following components:   Sodium 133 (*)    Potassium 3.4 (*)    Total Protein 8.8 (*)    All other components within normal limits  URINE CULTURE  LIPASE, BLOOD  CBC WITH DIFFERENTIAL/PLATELET  HCG, QUANTITATIVE, PREGNANCY  GC/CHLAMYDIA PROBE AMP ([place]) NOT AT [facility]    EKG None  Radiology No results found.  Procedures Procedures (including critical care time)  Medications Ordered in ED Medications  0.9 %  sodium chloride infusion ( Intravenous New Bag/Given 10/13/17 2247)  ondansetron (ZOFRAN) injection 4 mg (4 mg Intravenous Given 10/13/17 2245)  sodium chloride 0.9 % bolus 1,000 mL (1,000 mLs Intravenous New Bag/Given 10/13/17 2246)  acetaminophen (TYLENOL) tablet 650 mg (650 mg Oral Given 10/13/17 2253)     Initial Impression / Assessment and Plan / ED Course  I have reviewed the triage vital signs and the nursing notes.  Pertinent labs & imaging results that were available during my care of the patient were reviewed by me and considered in my medical decision making (see chart for details).     32 year old female presenting today approximately [redacted] weeks pregnant presenting with fever, abdominal pain and some vaginal discharge.  Patient has lower abdominal discomfort on exam, fever and tachycardia.  UA is concerning for UTI.  CBC, CMP within normal limits.  Lipase within normal limits.  Pregnancy test is positive.  Pelvic exam without localized tenderness.  Feel that patient needs ultrasound to confirm IUP, living fetus and to ensure no TOA or other cause for her fever and pelvic pain.  UA does have nitrites and 6-10 white blood cells.  Patient given fluids, Tylenol and  antibiotic.  [facility] see if patient can be transferred to get an ultrasound to evaluate above.  hCG is pending.  11:53 PM Spoke with MAU at [facility] and they are happy to accept the patient to complete the ultrasound and ensure there is no other acute issue.  Patient was given rocephin here.  Final Clinical Impressions(s) / ED Diagnoses   Final diagnoses:  First trimester pregnancy  Fever, unspecified fever cause  Acute pyelonephritis    ED Discharge Orders    None       Gwyneth Sprout, MD 10/13/17 2356

## 2017-10-13 NOTE — Discharge Instructions (Addendum)
[facility] [place] [facility] [place] to make sure everything with the baby is ok.

## 2017-10-13 NOTE — ED Triage Notes (Signed)
Pt c/o abdominal cramping, pink tinge d/c and + home pregnancy test, LMP 08/28/17. N/V denies Diarrhea

## 2017-10-13 NOTE — ED Notes (Signed)
[facility]

## 2017-10-13 NOTE — ED Notes (Addendum)
EDP into room, prior to RN assessment, see MD notes, orders received and initiated.   Alert, NAD, calm, interactive, resps e/u, speaking in clear complete sentences, no dyspnea noted, skin W&D, VSS, c/o abd cramping and nausea, also low grade fever, (denies: sob, dizziness or visual changes). Vomited x3 in last 24 hrs, last emesis Friday night. G8P4. GYN appt scheduled.

## 2017-10-14 ENCOUNTER — Inpatient Hospital Stay (HOSPITAL_COMMUNITY): Payer: 59

## 2017-10-14 ENCOUNTER — Encounter (HOSPITAL_COMMUNITY): Payer: Self-pay | Admitting: *Deleted

## 2017-10-14 DIAGNOSIS — R102 Pelvic and perineal pain: Secondary | ICD-10-CM | POA: Diagnosis not present

## 2017-10-14 DIAGNOSIS — O468X1 Other antepartum hemorrhage, first trimester: Secondary | ICD-10-CM | POA: Diagnosis not present

## 2017-10-14 DIAGNOSIS — N83209 Unspecified ovarian cyst, unspecified side: Secondary | ICD-10-CM | POA: Diagnosis not present

## 2017-10-14 DIAGNOSIS — O26891 Other specified pregnancy related conditions, first trimester: Secondary | ICD-10-CM

## 2017-10-14 DIAGNOSIS — O208 Other hemorrhage in early pregnancy: Secondary | ICD-10-CM | POA: Diagnosis not present

## 2017-10-14 DIAGNOSIS — O2341 Unspecified infection of urinary tract in pregnancy, first trimester: Secondary | ICD-10-CM | POA: Diagnosis not present

## 2017-10-14 DIAGNOSIS — Z3A01 Less than 8 weeks gestation of pregnancy: Secondary | ICD-10-CM | POA: Diagnosis not present

## 2017-10-14 DIAGNOSIS — O2301 Infections of kidney in pregnancy, first trimester: Secondary | ICD-10-CM | POA: Diagnosis not present

## 2017-10-14 DIAGNOSIS — O3481 Maternal care for other abnormalities of pelvic organs, first trimester: Secondary | ICD-10-CM | POA: Diagnosis not present

## 2017-10-14 DIAGNOSIS — R109 Unspecified abdominal pain: Secondary | ICD-10-CM

## 2017-10-14 DIAGNOSIS — O418X10 Other specified disorders of amniotic fluid and membranes, first trimester, not applicable or unspecified: Secondary | ICD-10-CM | POA: Diagnosis not present

## 2017-10-14 DIAGNOSIS — O9989 Other specified diseases and conditions complicating pregnancy, childbirth and the puerperium: Secondary | ICD-10-CM | POA: Diagnosis not present

## 2017-10-14 DIAGNOSIS — O161 Unspecified maternal hypertension, first trimester: Secondary | ICD-10-CM | POA: Diagnosis not present

## 2017-10-14 LAB — HCG, QUANTITATIVE, PREGNANCY: hCG, Beta Chain, Quant, S: 44138 m[IU]/mL — ABNORMAL HIGH (ref <5)

## 2017-10-14 MED ORDER — CEPHALEXIN 500 MG PO CAPS: 500.0000 mg | ORAL_CAPSULE | Freq: Four times a day (QID) | ORAL | 0 refills | Status: AC

## 2017-10-14 MED ORDER — PROMETHAZINE HCL 25 MG PO TABS: 12.5000 mg | ORAL_TABLET | Freq: Four times a day (QID) | ORAL | 2 refills | Status: AC | PRN

## 2017-10-14 MED ORDER — TRAMADOL HCL 50 MG PO TABS: 50.0000 mg | ORAL_TABLET | Freq: Four times a day (QID) | ORAL | 0 refills | Status: AC | PRN

## 2017-10-14 NOTE — ED Notes (Addendum)
Alert, NAD, calm, interactive, resps e/u, speaking in clear complete sentences, no dyspnea noted, skin W&D, VSS, , mentions pain and nausea, (denies: sob, or dizziness). [facility]

## 2017-10-14 NOTE — MAU Note (Signed)
Sent from [facility] for u/s. Had workup there and came here for u/s. Having pain in lower abd and no bleeding

## 2017-10-14 NOTE — MAU Note (Signed)
Sharen CounterLisa Leftwich Kirby CNM in Triage to discuss test results and plan of care with pt. Pt then d/c home from Triage

## 2017-10-15 LAB — GC/CHLAMYDIA PROBE AMP (~~LOC~~) NOT AT ARMC
Chlamydia: NEGATIVE
NEISSERIA GONORRHEA: NEGATIVE

## 2017-10-16 LAB — URINE CULTURE: Culture: >=100000 — AB

## 2017-11-04 DIAGNOSIS — R0981 Nasal congestion: Secondary | ICD-10-CM | POA: Diagnosis not present

## 2017-11-29 DIAGNOSIS — Z23 Encounter for immunization: Secondary | ICD-10-CM | POA: Diagnosis not present

## 2017-11-29 DIAGNOSIS — Z3201 Encounter for pregnancy test, result positive: Secondary | ICD-10-CM | POA: Diagnosis not present

## 2017-11-29 DIAGNOSIS — N911 Secondary amenorrhea: Secondary | ICD-10-CM | POA: Diagnosis not present

## 2017-11-29 DIAGNOSIS — N3001 Acute cystitis with hematuria: Secondary | ICD-10-CM | POA: Diagnosis not present

## 2017-11-29 DIAGNOSIS — R3989 Other symptoms and signs involving the genitourinary system: Secondary | ICD-10-CM | POA: Diagnosis not present

## 2017-12-03 DIAGNOSIS — O26841 Uterine size-date discrepancy, first trimester: Secondary | ICD-10-CM | POA: Diagnosis not present

## 2017-12-03 DIAGNOSIS — Z3A13 13 weeks gestation of pregnancy: Secondary | ICD-10-CM | POA: Diagnosis not present

## 2017-12-14 DIAGNOSIS — Z124 Encounter for screening for malignant neoplasm of cervix: Secondary | ICD-10-CM | POA: Diagnosis not present

## 2017-12-14 DIAGNOSIS — Z641 Problems related to multiparity: Secondary | ICD-10-CM | POA: Diagnosis not present

## 2017-12-14 DIAGNOSIS — Z1151 Encounter for screening for human papillomavirus (HPV): Secondary | ICD-10-CM | POA: Diagnosis not present

## 2017-12-14 DIAGNOSIS — R875 Abnormal microbiological findings in specimens from female genital organs: Secondary | ICD-10-CM | POA: Diagnosis not present

## 2017-12-14 DIAGNOSIS — Z3689 Encounter for other specified antenatal screening: Secondary | ICD-10-CM | POA: Diagnosis not present

## 2017-12-14 DIAGNOSIS — R87612 Low grade squamous intraepithelial lesion on cytologic smear of cervix (LGSIL): Secondary | ICD-10-CM | POA: Diagnosis not present

## 2017-12-14 DIAGNOSIS — Z8632 Personal history of gestational diabetes: Secondary | ICD-10-CM | POA: Diagnosis not present

## 2017-12-14 DIAGNOSIS — Z716 Tobacco abuse counseling: Secondary | ICD-10-CM | POA: Diagnosis not present

## 2017-12-14 DIAGNOSIS — N750 Cyst of Bartholin's gland: Secondary | ICD-10-CM | POA: Diagnosis not present

## 2017-12-14 DIAGNOSIS — O99332 Smoking (tobacco) complicating pregnancy, second trimester: Secondary | ICD-10-CM | POA: Diagnosis not present

## 2017-12-14 DIAGNOSIS — Z3A16 16 weeks gestation of pregnancy: Secondary | ICD-10-CM | POA: Diagnosis not present

## 2017-12-14 DIAGNOSIS — F320 Major depressive disorder, single episode, mild: Secondary | ICD-10-CM | POA: Diagnosis not present

## 2017-12-14 DIAGNOSIS — Z113 Encounter for screening for infections with a predominantly sexual mode of transmission: Secondary | ICD-10-CM | POA: Diagnosis not present

## 2017-12-14 DIAGNOSIS — Z8759 Personal history of other complications of pregnancy, childbirth and the puerperium: Secondary | ICD-10-CM | POA: Diagnosis not present

## 2017-12-17 DIAGNOSIS — Z8759 Personal history of other complications of pregnancy, childbirth and the puerperium: Secondary | ICD-10-CM | POA: Diagnosis not present

## 2017-12-17 DIAGNOSIS — Z789 Other specified health status: Secondary | ICD-10-CM | POA: Diagnosis not present

## 2017-12-17 DIAGNOSIS — A609 Anogenital herpesviral infection, unspecified: Secondary | ICD-10-CM | POA: Diagnosis not present

## 2017-12-17 DIAGNOSIS — Z3A16 16 weeks gestation of pregnancy: Secondary | ICD-10-CM | POA: Diagnosis not present

## 2017-12-17 DIAGNOSIS — Z8632 Personal history of gestational diabetes: Secondary | ICD-10-CM | POA: Diagnosis not present

## 2017-12-17 DIAGNOSIS — Z641 Problems related to multiparity: Secondary | ICD-10-CM | POA: Diagnosis not present

## 2017-12-17 DIAGNOSIS — N751 Abscess of Bartholin's gland: Secondary | ICD-10-CM | POA: Diagnosis not present

## 2017-12-25 DIAGNOSIS — Z641 Problems related to multiparity: Secondary | ICD-10-CM | POA: Diagnosis not present

## 2017-12-25 DIAGNOSIS — Z8632 Personal history of gestational diabetes: Secondary | ICD-10-CM | POA: Diagnosis not present

## 2017-12-25 DIAGNOSIS — Z789 Other specified health status: Secondary | ICD-10-CM | POA: Diagnosis not present

## 2017-12-25 DIAGNOSIS — A609 Anogenital herpesviral infection, unspecified: Secondary | ICD-10-CM | POA: Diagnosis not present

## 2017-12-25 DIAGNOSIS — Z6790 Unspecified blood type, Rh positive: Secondary | ICD-10-CM | POA: Diagnosis not present

## 2017-12-25 DIAGNOSIS — Z8709 Personal history of other diseases of the respiratory system: Secondary | ICD-10-CM | POA: Diagnosis not present

## 2017-12-25 DIAGNOSIS — Z8759 Personal history of other complications of pregnancy, childbirth and the puerperium: Secondary | ICD-10-CM | POA: Diagnosis not present

## 2017-12-25 DIAGNOSIS — O99332 Smoking (tobacco) complicating pregnancy, second trimester: Secondary | ICD-10-CM | POA: Diagnosis not present

## 2017-12-25 DIAGNOSIS — Z3A18 18 weeks gestation of pregnancy: Secondary | ICD-10-CM | POA: Diagnosis not present

## 2018-01-02 DIAGNOSIS — Z3A19 19 weeks gestation of pregnancy: Secondary | ICD-10-CM | POA: Diagnosis not present

## 2018-01-02 DIAGNOSIS — Z363 Encounter for antenatal screening for malformations: Secondary | ICD-10-CM | POA: Diagnosis not present

## 2018-08-27 ENCOUNTER — Encounter (HOSPITAL_COMMUNITY): Payer: Self-pay

## 2019-10-23 IMAGING — US US OB COMP LESS 14 WK
1 series · 15 of 26 positions shown · non-contrast
Comparison: None.

CLINICAL DATA: Abdominal pain

EXAM:
OBSTETRIC <14 WK US AND TRANSVAGINAL OB US
TECHNIQUE: Both transabdominal and transvaginal ultrasound examinations were
performed for complete evaluation of the gestation as well as the
maternal uterus, adnexal regions, and pelvic cul-de-sac.
Transvaginal technique was performed to assess early pregnancy.

[Series 1: us ob comp less 14 wk · 26 acquisitions, 15 frames shown]
[im 1/26]
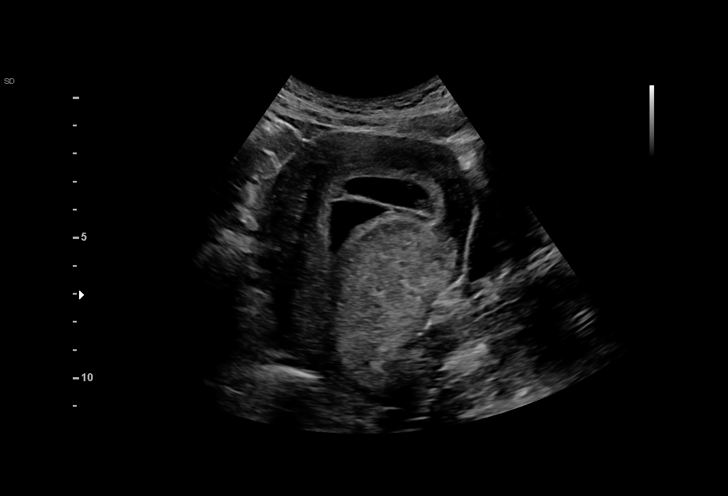
[im 3/26]
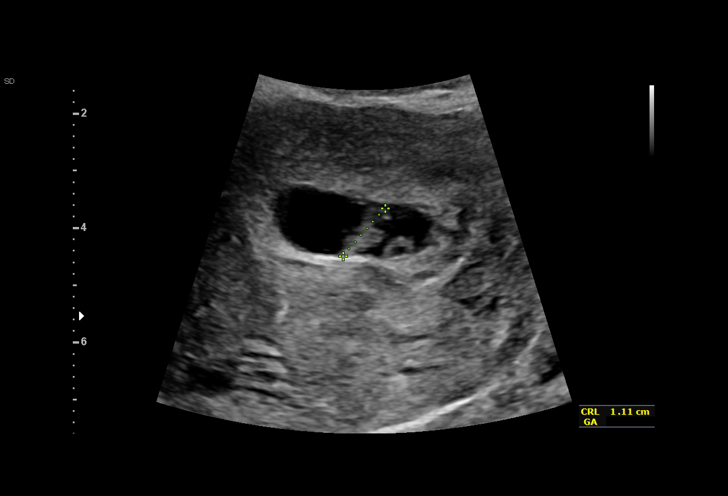
[im 5/26]
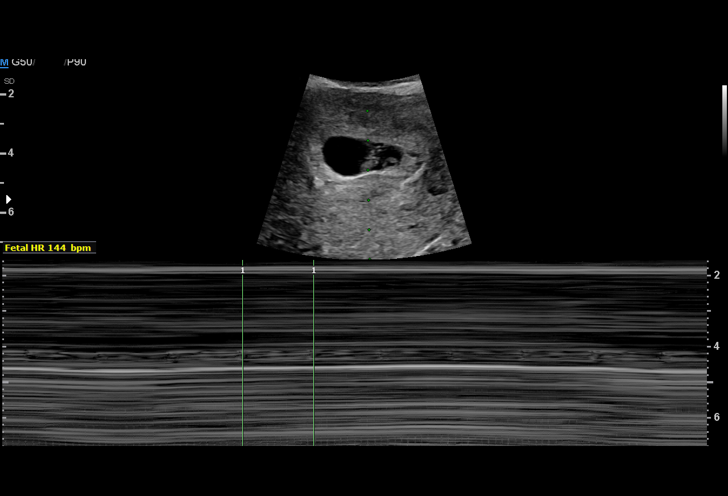
[im 7/26]
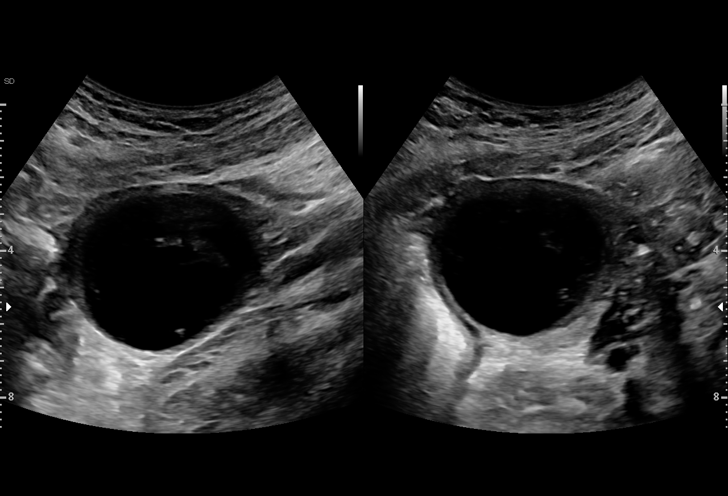
[im 8/26]
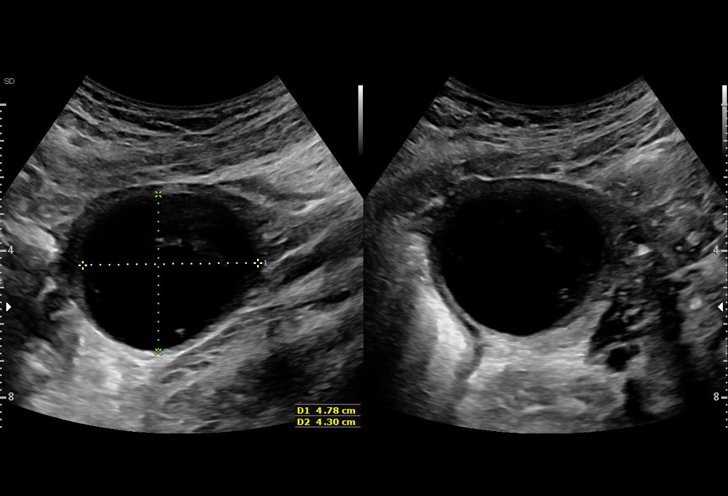
[im 10/26]
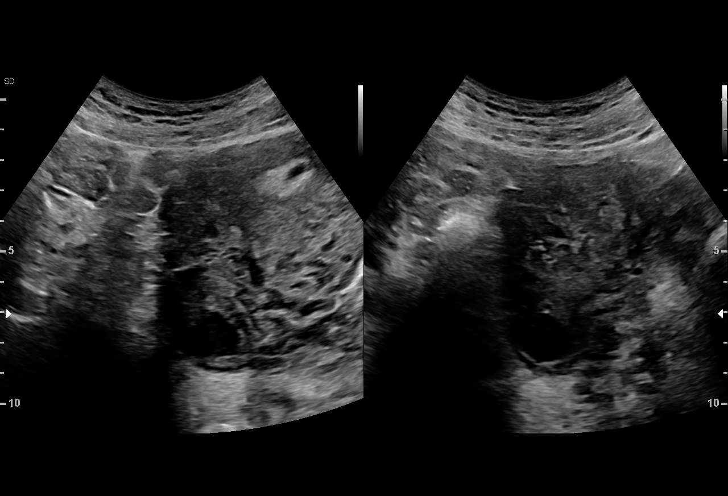
[im 12/26]
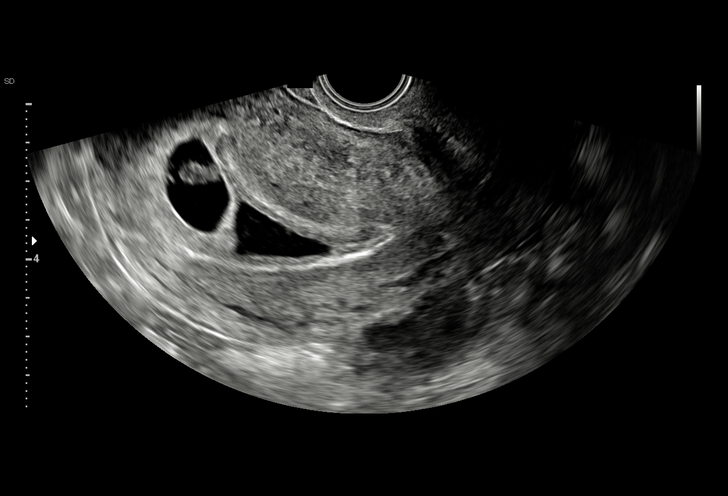
[im 14/26]
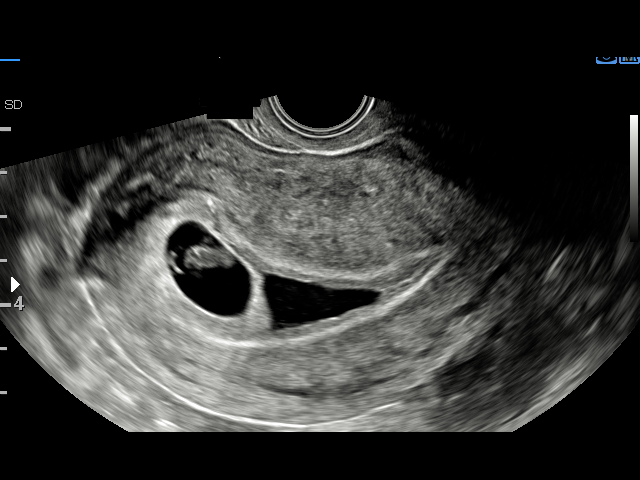
[im 15/26]
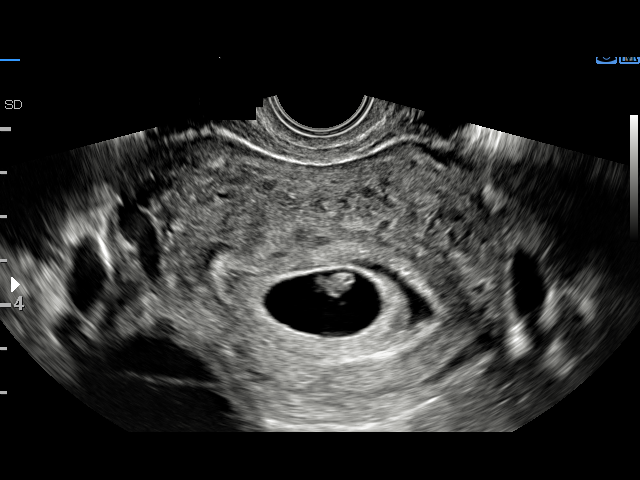
[im 17/26]
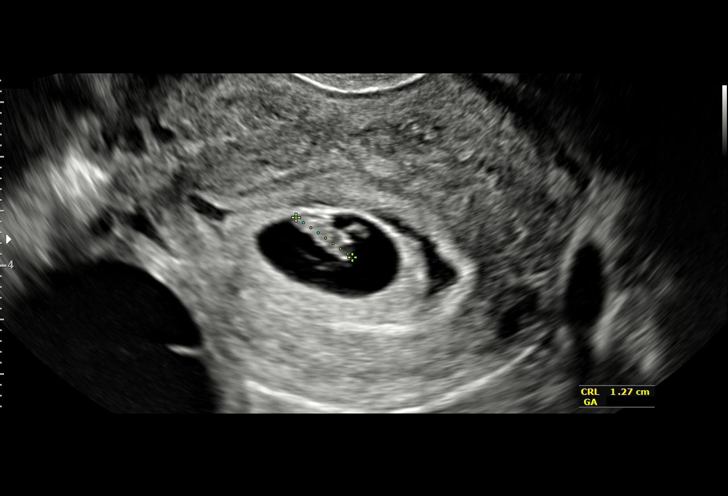
[im 19/26]
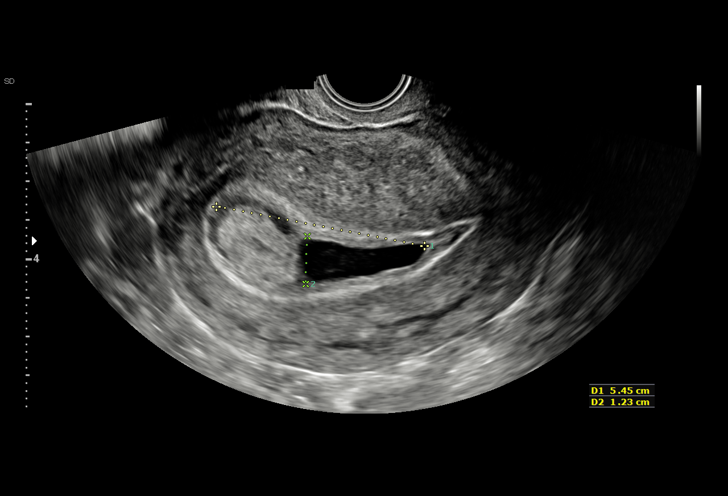
[im 20/26]
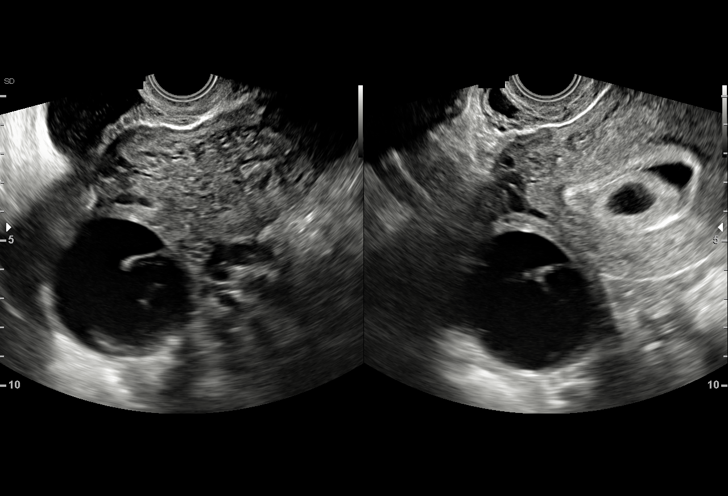
[im 22/26]
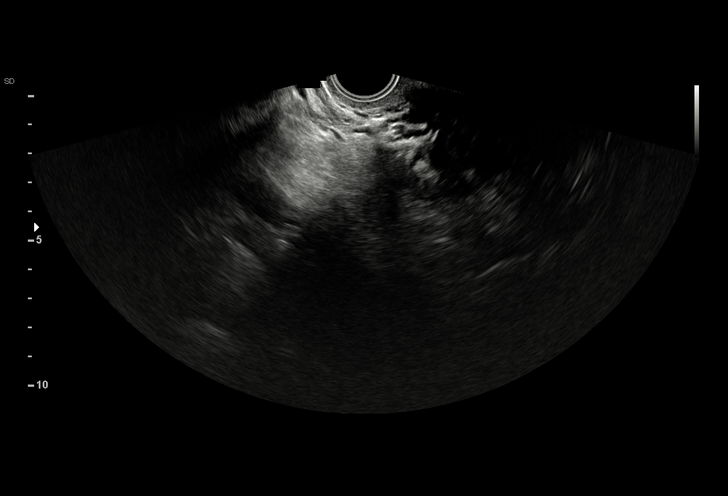
[im 24/26]
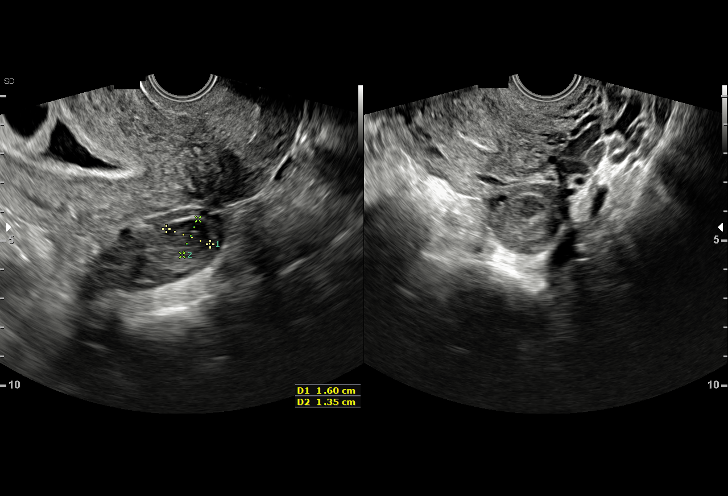
[im 26/26]
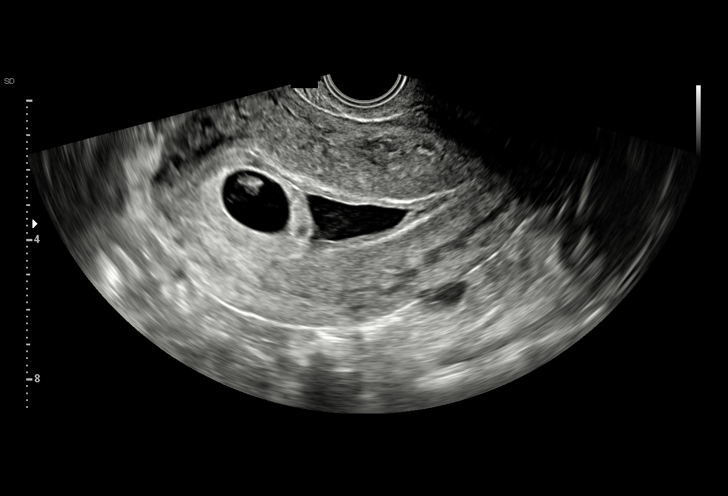

[15 of 26 positions shown; findings below may reference images not displayed]

FINDINGS: Intrauterine gestational sac: Single intrauterine gestational sac

Yolk sac:  Visible

Embryo:  Visible

Cardiac Activity: Visible

Heart Rate: 145 bpm

CRL: 11.9 mm   7 w   2 d                  US EDC: 05/31/2018

Subchorionic hemorrhage: Moderate subchorionic hemorrhage along
inferior aspect of the sac

Maternal uterus/adnexae: Mildly complex cyst in the right ovary
measuring 4.8 x 4.3 by 4.2 cm. Small probable hemorrhagic cyst left
ovary measuring 1.6 x 1.4 by 1 cm. No significant free fluid
IMPRESSION: 1. Single viable intrauterine pregnancy as above.
2. Moderate subchorionic hemorrhage.
3. Complex cyst in the right ovary measuring 5 cm, probably
hemorrhagic cyst, but could consider short interval 6-12 week
sonographic follow-up to ensure resolution.
